# Patient Record
Sex: Female | Born: 1965 | Race: White | Hispanic: No | Marital: Married | Smoking: Never smoker
Health system: Southern US, Community
[De-identification: ages and names within clinical notes are randomized; demographics above are authoritative.]

## PROBLEM LIST (undated history)

## (undated) DIAGNOSIS — D869 Sarcoidosis, unspecified: Secondary | ICD-10-CM

## (undated) DIAGNOSIS — G43909 Migraine, unspecified, not intractable, without status migrainosus: Secondary | ICD-10-CM

## (undated) DIAGNOSIS — E039 Hypothyroidism, unspecified: Secondary | ICD-10-CM

## (undated) DIAGNOSIS — E041 Nontoxic single thyroid nodule: Secondary | ICD-10-CM

## (undated) HISTORY — DX: Migraine, unspecified, not intractable, without status migrainosus: G43.909

## (undated) HISTORY — DX: Nontoxic single thyroid nodule: E04.1

## (undated) HISTORY — DX: Sarcoidosis, unspecified: D86.9

## (undated) HISTORY — DX: Hypothyroidism, unspecified: E03.9

## (undated) HISTORY — PX: OTHER SURGICAL HISTORY: SHX169

---

## 1998-02-02 ENCOUNTER — Other Ambulatory Visit: Admission: RE | Admit: 1998-02-02 | Discharge: 1998-02-02 | Payer: Self-pay | Admitting: Gynecology

## 1998-02-07 ENCOUNTER — Encounter: Admission: RE | Admit: 1998-02-07 | Discharge: 1998-05-08 | Payer: Self-pay | Admitting: Obstetrics and Gynecology

## 1998-02-16 ENCOUNTER — Inpatient Hospital Stay (HOSPITAL_COMMUNITY): Admission: AD | Admit: 1998-02-16 | Discharge: 1998-02-18 | Payer: Self-pay | Admitting: Obstetrics and Gynecology

## 1998-03-28 ENCOUNTER — Other Ambulatory Visit: Admission: RE | Admit: 1998-03-28 | Discharge: 1998-03-28 | Payer: Self-pay | Admitting: Gynecology

## 1998-07-04 ENCOUNTER — Ambulatory Visit (HOSPITAL_COMMUNITY): Admission: RE | Admit: 1998-07-04 | Discharge: 1998-07-04 | Payer: Self-pay | Admitting: Gynecology

## 1998-11-30 ENCOUNTER — Ambulatory Visit (HOSPITAL_COMMUNITY): Admission: RE | Admit: 1998-11-30 | Discharge: 1998-11-30 | Payer: Self-pay | Admitting: Gynecology

## 1999-05-16 ENCOUNTER — Other Ambulatory Visit: Admission: RE | Admit: 1999-05-16 | Discharge: 1999-05-16 | Payer: Self-pay | Admitting: Obstetrics and Gynecology

## 2000-06-26 ENCOUNTER — Other Ambulatory Visit: Admission: RE | Admit: 2000-06-26 | Discharge: 2000-06-26 | Payer: Self-pay | Admitting: Obstetrics and Gynecology

## 2000-10-10 ENCOUNTER — Ambulatory Visit (HOSPITAL_BASED_OUTPATIENT_CLINIC_OR_DEPARTMENT_OTHER): Admission: RE | Admit: 2000-10-10 | Discharge: 2000-10-10 | Payer: Self-pay | Admitting: Surgery

## 2001-09-11 ENCOUNTER — Other Ambulatory Visit: Admission: RE | Admit: 2001-09-11 | Discharge: 2001-09-11 | Payer: Self-pay | Admitting: Obstetrics and Gynecology

## 2002-11-03 ENCOUNTER — Other Ambulatory Visit: Admission: RE | Admit: 2002-11-03 | Discharge: 2002-11-03 | Payer: Self-pay | Admitting: Obstetrics and Gynecology

## 2003-09-19 ENCOUNTER — Ambulatory Visit (HOSPITAL_COMMUNITY): Admission: RE | Admit: 2003-09-19 | Discharge: 2003-09-19 | Payer: Self-pay | Admitting: Obstetrics and Gynecology

## 2005-02-15 ENCOUNTER — Other Ambulatory Visit: Admission: RE | Admit: 2005-02-15 | Discharge: 2005-02-15 | Payer: Self-pay | Admitting: Obstetrics and Gynecology

## 2006-02-26 ENCOUNTER — Ambulatory Visit (HOSPITAL_COMMUNITY): Admission: RE | Admit: 2006-02-26 | Discharge: 2006-02-26 | Payer: Self-pay | Admitting: Internal Medicine

## 2006-03-14 ENCOUNTER — Ambulatory Visit: Payer: Self-pay | Admitting: Internal Medicine

## 2007-04-10 ENCOUNTER — Ambulatory Visit (HOSPITAL_COMMUNITY): Admission: RE | Admit: 2007-04-10 | Discharge: 2007-04-10 | Payer: Self-pay | Admitting: Internal Medicine

## 2008-03-22 ENCOUNTER — Ambulatory Visit: Payer: Self-pay | Admitting: Internal Medicine

## 2008-09-06 ENCOUNTER — Ambulatory Visit (HOSPITAL_COMMUNITY): Admission: RE | Admit: 2008-09-06 | Discharge: 2008-09-06 | Payer: Self-pay | Admitting: Internal Medicine

## 2009-09-07 ENCOUNTER — Ambulatory Visit (HOSPITAL_COMMUNITY): Admission: RE | Admit: 2009-09-07 | Discharge: 2009-09-07 | Payer: Self-pay | Admitting: Internal Medicine

## 2010-10-08 ENCOUNTER — Other Ambulatory Visit (HOSPITAL_COMMUNITY): Payer: Self-pay | Admitting: Internal Medicine

## 2010-10-08 ENCOUNTER — Ambulatory Visit (HOSPITAL_COMMUNITY): Admission: RE | Admit: 2010-10-08 | Payer: Self-pay | Source: Home / Self Care | Admitting: Internal Medicine

## 2010-10-08 DIAGNOSIS — Z0000 Encounter for general adult medical examination without abnormal findings: Secondary | ICD-10-CM

## 2010-10-12 ENCOUNTER — Ambulatory Visit (HOSPITAL_COMMUNITY)
Admission: RE | Admit: 2010-10-12 | Discharge: 2010-10-12 | Disposition: A | Discharge status: Home / Self Care | Payer: BLUE CROSS/BLUE SHIELD | Source: Ambulatory Visit | Attending: Internal Medicine | Admitting: Internal Medicine

## 2010-10-12 DIAGNOSIS — Z0000 Encounter for general adult medical examination without abnormal findings: Secondary | ICD-10-CM

## 2010-10-12 DIAGNOSIS — Z1231 Encounter for screening mammogram for malignant neoplasm of breast: Secondary | ICD-10-CM

## 2010-10-13 ENCOUNTER — Encounter: Payer: Self-pay | Admitting: Internal Medicine

## 2011-01-25 NOTE — Op Note (Signed)
[facility]  Patient:    Susan Griffith, Susan Griffith            MRN: [id number] Proc. Date: 10/10/00 Adm. Date:  [id number] Attending:  Charlton Haws                           Operative Report  CCS [phone].  PREOPERATIVE DIAGNOSIS:  Right inguinal hernia.  POSTOPERATIVE DIAGNOSIS:  Right inguinal hernia.  PROCEDURE:  Repair right inguinal hernia.  SURGEON:  Currie Paris, M.D.  ANESTHESIA:  MAC.  CLINICAL HISTORY:  This patient is a 45 year old with a symptomatic small right inguinal hernia, who elected to have this repaired.  DESCRIPTION OF PROCEDURE:  The patient was brought to the operating room, having had the operative side identified and marked preoperatively in the holding area.  After satisfactory IV sedation was attained, the lower abdomen and groin areas were prepped and draped as a sterile field.  Xylocaine 1% was mixed equally with 0.5% Marcaine with epinephrine and used for local.  I infiltrated along the incision line plus a spot just medial, superior to the anterior superior iliac spine to get a subfascial injection.  Incision was made and deepened to the external oblique aponeurosis, with bleeders coagulated or tied with 4-0 Vicryl.  The external oblique was opened in the line of its fibers.  The cord structures, which consisted of the round ligament, its vessels, and the ilioinguinal nerve, were dissected up off the inguinal floor and elevated with an [employer] retractor.  The inguinal floor appeared intact.  There was preperitoneal fat in a small indirect sac protruding through the anteromedial aspect of the cord.  This was reduced neatly back into the peritoneal cavity and a suture used to hold this in place.  I then took a piece of 3 x 6 inch mesh and cut it to a more appropriate smaller size, tapered it at one end, and sutured it in to reinforce the floor and go around the cord structures.  This was  sutured inferiorly with 2-0 Prolene and tacked medially similarly.  It lay nicely and well past the deep ring and I think recreated a nice deep ring that was just enough to allow the round ligament and nerve to come through without any tethering.  The wound was checked for hemostasis and appeared to be dry.  I infiltrated additional local as I worked.  The external oblique was then closed with 3-0 Vicryl, Scarpas with 3-0 Vicryl, and the skin with 4-0 Monocryl subcuticular plus Steri-Strips.  The patient tolerated the procedure well.  There were no operative complications.  All counts were correct. DD:  10/10/00 TD:  10/11/00 Job: [id number]

## 2011-10-10 ENCOUNTER — Other Ambulatory Visit (HOSPITAL_COMMUNITY): Payer: Self-pay | Admitting: Internal Medicine

## 2011-10-10 DIAGNOSIS — Z1231 Encounter for screening mammogram for malignant neoplasm of breast: Secondary | ICD-10-CM

## 2011-11-08 ENCOUNTER — Ambulatory Visit (HOSPITAL_COMMUNITY)
Admission: RE | Admit: 2011-11-08 | Discharge: 2011-11-08 | Disposition: A | Discharge status: Home / Self Care | Payer: BC Managed Care – PPO | Source: Ambulatory Visit | Attending: Internal Medicine | Admitting: Internal Medicine

## 2011-11-08 DIAGNOSIS — Z1231 Encounter for screening mammogram for malignant neoplasm of breast: Secondary | ICD-10-CM

## 2012-11-12 ENCOUNTER — Other Ambulatory Visit (HOSPITAL_COMMUNITY): Payer: Self-pay | Admitting: Internal Medicine

## 2012-11-12 DIAGNOSIS — Z1231 Encounter for screening mammogram for malignant neoplasm of breast: Secondary | ICD-10-CM

## 2012-11-24 ENCOUNTER — Ambulatory Visit (HOSPITAL_COMMUNITY): Payer: BLUE CROSS/BLUE SHIELD

## 2012-12-01 ENCOUNTER — Ambulatory Visit (HOSPITAL_COMMUNITY): Payer: BC Managed Care – PPO

## 2012-12-09 ENCOUNTER — Ambulatory Visit (HOSPITAL_COMMUNITY): Payer: BC Managed Care – PPO

## 2012-12-21 ENCOUNTER — Ambulatory Visit (HOSPITAL_COMMUNITY)
Admission: RE | Admit: 2012-12-21 | Discharge: 2012-12-21 | Disposition: A | Discharge status: Home / Self Care | Payer: BC Managed Care – PPO | Source: Ambulatory Visit | Attending: Internal Medicine | Admitting: Internal Medicine

## 2012-12-21 DIAGNOSIS — Z1231 Encounter for screening mammogram for malignant neoplasm of breast: Secondary | ICD-10-CM

## 2012-12-22 ENCOUNTER — Other Ambulatory Visit: Payer: Self-pay | Admitting: Internal Medicine

## 2012-12-22 DIAGNOSIS — R928 Other abnormal and inconclusive findings on diagnostic imaging of breast: Secondary | ICD-10-CM

## 2013-01-01 ENCOUNTER — Ambulatory Visit
Admission: RE | Admit: 2013-01-01 | Discharge: 2013-01-01 | Disposition: A | Discharge status: Home / Self Care | Payer: BC Managed Care – PPO | Source: Ambulatory Visit | Attending: Internal Medicine | Admitting: Internal Medicine

## 2013-01-01 DIAGNOSIS — R928 Other abnormal and inconclusive findings on diagnostic imaging of breast: Secondary | ICD-10-CM

## 2013-06-07 ENCOUNTER — Ambulatory Visit: Payer: Self-pay

## 2013-07-23 ENCOUNTER — Ambulatory Visit (INDEPENDENT_AMBULATORY_CARE_PROVIDER_SITE_OTHER): Payer: BC Managed Care – PPO | Admitting: General Surgery

## 2014-02-14 ENCOUNTER — Other Ambulatory Visit (HOSPITAL_COMMUNITY): Payer: Self-pay | Admitting: Internal Medicine

## 2014-02-14 DIAGNOSIS — Z1231 Encounter for screening mammogram for malignant neoplasm of breast: Secondary | ICD-10-CM

## 2014-02-21 ENCOUNTER — Ambulatory Visit (HOSPITAL_COMMUNITY)
Admission: RE | Admit: 2014-02-21 | Discharge: 2014-02-21 | Disposition: A | Discharge status: Home / Self Care | Payer: BC Managed Care – PPO | Source: Ambulatory Visit | Attending: Internal Medicine | Admitting: Internal Medicine

## 2014-02-21 DIAGNOSIS — Z1231 Encounter for screening mammogram for malignant neoplasm of breast: Secondary | ICD-10-CM | POA: Insufficient documentation

## 2015-03-27 ENCOUNTER — Other Ambulatory Visit (HOSPITAL_COMMUNITY): Payer: Self-pay | Admitting: Internal Medicine

## 2015-03-27 DIAGNOSIS — Z1231 Encounter for screening mammogram for malignant neoplasm of breast: Secondary | ICD-10-CM

## 2015-04-04 ENCOUNTER — Ambulatory Visit (HOSPITAL_COMMUNITY): Payer: Self-pay

## 2015-04-11 ENCOUNTER — Ambulatory Visit (HOSPITAL_COMMUNITY): Payer: Self-pay

## 2015-04-13 ENCOUNTER — Ambulatory Visit (HOSPITAL_COMMUNITY)
Admission: RE | Admit: 2015-04-13 | Discharge: 2015-04-13 | Disposition: A | Discharge status: Home / Self Care | Payer: BLUE CROSS/BLUE SHIELD | Source: Ambulatory Visit | Attending: Internal Medicine | Admitting: Internal Medicine

## 2015-04-13 DIAGNOSIS — Z1231 Encounter for screening mammogram for malignant neoplasm of breast: Secondary | ICD-10-CM | POA: Diagnosis not present

## 2016-01-18 DIAGNOSIS — Z124 Encounter for screening for malignant neoplasm of cervix: Secondary | ICD-10-CM | POA: Diagnosis not present

## 2016-01-18 DIAGNOSIS — R635 Abnormal weight gain: Secondary | ICD-10-CM | POA: Diagnosis not present

## 2016-01-18 DIAGNOSIS — Z0001 Encounter for general adult medical examination with abnormal findings: Secondary | ICD-10-CM | POA: Diagnosis not present

## 2016-01-18 DIAGNOSIS — E040 Nontoxic diffuse goiter: Secondary | ICD-10-CM | POA: Diagnosis not present

## 2016-02-28 DIAGNOSIS — E040 Nontoxic diffuse goiter: Secondary | ICD-10-CM | POA: Diagnosis not present

## 2016-04-19 DIAGNOSIS — R030 Elevated blood-pressure reading, without diagnosis of hypertension: Secondary | ICD-10-CM | POA: Diagnosis not present

## 2016-04-19 DIAGNOSIS — E040 Nontoxic diffuse goiter: Secondary | ICD-10-CM | POA: Diagnosis not present

## 2016-04-19 DIAGNOSIS — E069 Thyroiditis, unspecified: Secondary | ICD-10-CM | POA: Diagnosis not present

## 2016-04-19 DIAGNOSIS — Z6830 Body mass index (BMI) 30.0-30.9, adult: Secondary | ICD-10-CM | POA: Diagnosis not present

## 2016-06-24 ENCOUNTER — Other Ambulatory Visit: Payer: Self-pay | Admitting: Internal Medicine

## 2016-06-24 DIAGNOSIS — Z1231 Encounter for screening mammogram for malignant neoplasm of breast: Secondary | ICD-10-CM

## 2016-07-10 ENCOUNTER — Ambulatory Visit
Admission: RE | Admit: 2016-07-10 | Discharge: 2016-07-10 | Disposition: A | Discharge status: Home / Self Care | Payer: BLUE CROSS/BLUE SHIELD | Source: Ambulatory Visit | Attending: Internal Medicine | Admitting: Internal Medicine

## 2016-07-10 DIAGNOSIS — Z1231 Encounter for screening mammogram for malignant neoplasm of breast: Secondary | ICD-10-CM

## 2016-08-19 DIAGNOSIS — Z6830 Body mass index (BMI) 30.0-30.9, adult: Secondary | ICD-10-CM | POA: Diagnosis not present

## 2016-08-19 DIAGNOSIS — E069 Thyroiditis, unspecified: Secondary | ICD-10-CM | POA: Diagnosis not present

## 2016-10-23 DIAGNOSIS — E039 Hypothyroidism, unspecified: Secondary | ICD-10-CM | POA: Diagnosis not present

## 2016-10-23 DIAGNOSIS — R635 Abnormal weight gain: Secondary | ICD-10-CM | POA: Diagnosis not present

## 2016-10-23 DIAGNOSIS — J019 Acute sinusitis, unspecified: Secondary | ICD-10-CM | POA: Diagnosis not present

## 2016-10-23 DIAGNOSIS — R030 Elevated blood-pressure reading, without diagnosis of hypertension: Secondary | ICD-10-CM | POA: Diagnosis not present

## 2016-12-16 DIAGNOSIS — E663 Overweight: Secondary | ICD-10-CM | POA: Diagnosis not present

## 2016-12-16 DIAGNOSIS — R030 Elevated blood-pressure reading, without diagnosis of hypertension: Secondary | ICD-10-CM | POA: Diagnosis not present

## 2017-02-26 DIAGNOSIS — R030 Elevated blood-pressure reading, without diagnosis of hypertension: Secondary | ICD-10-CM | POA: Diagnosis not present

## 2017-02-26 DIAGNOSIS — E663 Overweight: Secondary | ICD-10-CM | POA: Diagnosis not present

## 2017-02-26 DIAGNOSIS — E039 Hypothyroidism, unspecified: Secondary | ICD-10-CM | POA: Diagnosis not present

## 2017-05-06 DIAGNOSIS — Z6831 Body mass index (BMI) 31.0-31.9, adult: Secondary | ICD-10-CM | POA: Diagnosis not present

## 2017-05-06 DIAGNOSIS — Z0001 Encounter for general adult medical examination with abnormal findings: Secondary | ICD-10-CM | POA: Diagnosis not present

## 2017-05-06 DIAGNOSIS — R030 Elevated blood-pressure reading, without diagnosis of hypertension: Secondary | ICD-10-CM | POA: Diagnosis not present

## 2017-06-19 DIAGNOSIS — E039 Hypothyroidism, unspecified: Secondary | ICD-10-CM | POA: Diagnosis not present

## 2017-06-19 DIAGNOSIS — Z0001 Encounter for general adult medical examination with abnormal findings: Secondary | ICD-10-CM | POA: Diagnosis not present

## 2017-06-19 DIAGNOSIS — I10 Essential (primary) hypertension: Secondary | ICD-10-CM | POA: Diagnosis not present

## 2017-07-07 DIAGNOSIS — E039 Hypothyroidism, unspecified: Secondary | ICD-10-CM | POA: Diagnosis not present

## 2017-07-07 DIAGNOSIS — R030 Elevated blood-pressure reading, without diagnosis of hypertension: Secondary | ICD-10-CM | POA: Diagnosis not present

## 2017-07-07 DIAGNOSIS — E663 Overweight: Secondary | ICD-10-CM | POA: Diagnosis not present

## 2017-08-22 ENCOUNTER — Other Ambulatory Visit: Payer: Self-pay | Admitting: Internal Medicine

## 2017-08-22 DIAGNOSIS — Z1231 Encounter for screening mammogram for malignant neoplasm of breast: Secondary | ICD-10-CM

## 2017-09-24 ENCOUNTER — Ambulatory Visit
Admission: RE | Admit: 2017-09-24 | Discharge: 2017-09-24 | Disposition: A | Discharge status: Home / Self Care | Payer: BLUE CROSS/BLUE SHIELD | Source: Ambulatory Visit | Attending: Internal Medicine | Admitting: Internal Medicine

## 2017-09-24 DIAGNOSIS — Z1231 Encounter for screening mammogram for malignant neoplasm of breast: Secondary | ICD-10-CM

## 2017-12-29 DIAGNOSIS — M546 Pain in thoracic spine: Secondary | ICD-10-CM | POA: Diagnosis not present

## 2017-12-29 DIAGNOSIS — M9901 Segmental and somatic dysfunction of cervical region: Secondary | ICD-10-CM | POA: Diagnosis not present

## 2017-12-29 DIAGNOSIS — M9902 Segmental and somatic dysfunction of thoracic region: Secondary | ICD-10-CM | POA: Diagnosis not present

## 2017-12-29 DIAGNOSIS — M5413 Radiculopathy, cervicothoracic region: Secondary | ICD-10-CM | POA: Diagnosis not present

## 2017-12-30 DIAGNOSIS — M546 Pain in thoracic spine: Secondary | ICD-10-CM | POA: Diagnosis not present

## 2017-12-30 DIAGNOSIS — M5413 Radiculopathy, cervicothoracic region: Secondary | ICD-10-CM | POA: Diagnosis not present

## 2017-12-30 DIAGNOSIS — M9902 Segmental and somatic dysfunction of thoracic region: Secondary | ICD-10-CM | POA: Diagnosis not present

## 2017-12-30 DIAGNOSIS — M9901 Segmental and somatic dysfunction of cervical region: Secondary | ICD-10-CM | POA: Diagnosis not present

## 2018-01-01 DIAGNOSIS — M9902 Segmental and somatic dysfunction of thoracic region: Secondary | ICD-10-CM | POA: Diagnosis not present

## 2018-01-01 DIAGNOSIS — M9901 Segmental and somatic dysfunction of cervical region: Secondary | ICD-10-CM | POA: Diagnosis not present

## 2018-01-01 DIAGNOSIS — M5413 Radiculopathy, cervicothoracic region: Secondary | ICD-10-CM | POA: Diagnosis not present

## 2018-01-01 DIAGNOSIS — M546 Pain in thoracic spine: Secondary | ICD-10-CM | POA: Diagnosis not present

## 2018-01-05 DIAGNOSIS — M5413 Radiculopathy, cervicothoracic region: Secondary | ICD-10-CM | POA: Diagnosis not present

## 2018-01-05 DIAGNOSIS — M546 Pain in thoracic spine: Secondary | ICD-10-CM | POA: Diagnosis not present

## 2018-01-05 DIAGNOSIS — M9901 Segmental and somatic dysfunction of cervical region: Secondary | ICD-10-CM | POA: Diagnosis not present

## 2018-01-05 DIAGNOSIS — M9902 Segmental and somatic dysfunction of thoracic region: Secondary | ICD-10-CM | POA: Diagnosis not present

## 2018-01-08 DIAGNOSIS — M5413 Radiculopathy, cervicothoracic region: Secondary | ICD-10-CM | POA: Diagnosis not present

## 2018-01-08 DIAGNOSIS — M9902 Segmental and somatic dysfunction of thoracic region: Secondary | ICD-10-CM | POA: Diagnosis not present

## 2018-01-08 DIAGNOSIS — M9901 Segmental and somatic dysfunction of cervical region: Secondary | ICD-10-CM | POA: Diagnosis not present

## 2018-01-08 DIAGNOSIS — M546 Pain in thoracic spine: Secondary | ICD-10-CM | POA: Diagnosis not present

## 2018-01-13 DIAGNOSIS — M5413 Radiculopathy, cervicothoracic region: Secondary | ICD-10-CM | POA: Diagnosis not present

## 2018-01-13 DIAGNOSIS — M9902 Segmental and somatic dysfunction of thoracic region: Secondary | ICD-10-CM | POA: Diagnosis not present

## 2018-01-13 DIAGNOSIS — M546 Pain in thoracic spine: Secondary | ICD-10-CM | POA: Diagnosis not present

## 2018-01-13 DIAGNOSIS — M9901 Segmental and somatic dysfunction of cervical region: Secondary | ICD-10-CM | POA: Diagnosis not present

## 2018-02-05 DIAGNOSIS — M9901 Segmental and somatic dysfunction of cervical region: Secondary | ICD-10-CM | POA: Diagnosis not present

## 2018-02-05 DIAGNOSIS — M5413 Radiculopathy, cervicothoracic region: Secondary | ICD-10-CM | POA: Diagnosis not present

## 2018-02-05 DIAGNOSIS — M9902 Segmental and somatic dysfunction of thoracic region: Secondary | ICD-10-CM | POA: Diagnosis not present

## 2018-02-05 DIAGNOSIS — M546 Pain in thoracic spine: Secondary | ICD-10-CM | POA: Diagnosis not present

## 2018-04-24 ENCOUNTER — Encounter: Payer: Self-pay | Admitting: Nurse Practitioner

## 2018-04-24 ENCOUNTER — Ambulatory Visit: Payer: Self-pay | Admitting: Nurse Practitioner

## 2018-04-24 VITALS — BP 141/88 | HR 76 | Resp 16 | Ht 68.75 in | Wt 209.4 lb

## 2018-04-24 DIAGNOSIS — N959 Unspecified menopausal and perimenopausal disorder: Secondary | ICD-10-CM

## 2018-04-24 DIAGNOSIS — E669 Obesity, unspecified: Secondary | ICD-10-CM | POA: Diagnosis not present

## 2018-04-24 DIAGNOSIS — M25551 Pain in right hip: Secondary | ICD-10-CM | POA: Insufficient documentation

## 2018-04-24 MED ORDER — PHENTERMINE HCL 37.5 MG PO TABS: 37.5000 mg | ORAL_TABLET | Freq: Every day | ORAL | 1 refills | Status: DC

## 2018-04-24 NOTE — Progress Notes (Signed)
[facility] [place], [place] [address]  Internal MEDICINE  Office Visit Note  Patient Name: Susan Griffith  [id number]  [id number]  Date of Service: 05/06/2018  Chief Complaint  Patient presents with  . Medical Management of Chronic Issues    medications    The patient is c/o moderate right hip pain. Has been getting more severe over time. Hurts to sit or stand on right left for long periods of time. Starting to interfere with her normal daily activities. Reports fatigue and difficulty with losing weight. Has been carefully monitoring her diet and has been exercising regularly. She has lost 1 pound over last 10 months. Has done well on phentermine in the past and would like to try this again.       Current Medication: Outpatient Encounter Medications as of 04/24/2018  Medication Sig  . albuterol (PROVENTIL HFA;VENTOLIN HFA) 108 (90 Base) MCG/ACT inhaler Inhale into the lungs every 6 (six) hours as needed for wheezing or shortness of breath.  . hydrochlorothiazide (HYDRODIURIL) 12.5 MG tablet Take 12.5 mg by mouth daily.  . phentermine (ADIPEX-P) 37.5 MG tablet Take 1 tablet (37.5 mg total) by mouth daily before breakfast.  . [DISCONTINUED] Phendimetrazine Tartrate 105 MG CP24 Take by mouth.   No facility-administered encounter medications on file as of 04/24/2018.     Surgical History: Past Surgical History:  Procedure Laterality Date  . child birth     natural    Medical History: Past Medical History:  Diagnosis Date  . Hypothyroidism   . Sarcoidosis     Family History: Family History  Problem Relation Age of Onset  . Hyperlipidemia Mother   . Hypertension Mother     Social History   Socioeconomic History  . Marital status: Married    Spouse name: Not on file  . Number of children: Not on file  . Years of education: Not on file  . Highest education level: Not on file  Occupational History  . Not on file  Social  Needs  . Financial resource strain: Not on file  . Food insecurity:    Worry: Not on file    Inability: Not on file  . Transportation needs:    Medical: Not on file    Non-medical: Not on file  Tobacco Use  . Smoking status: Never Smoker  . Smokeless tobacco: Never Used  Substance and Sexual Activity  . Alcohol use: Yes    Comment: ocassionally  . Drug use: Never  . Sexual activity: Not on file  Lifestyle  . Physical activity:    Days per week: Not on file    Minutes per session: Not on file  . Stress: Not on file  Relationships  . Social connections:    Talks on phone: Not on file    Gets together: Not on file    Attends religious service: Not on file    Active member of club or organization: Not on file    Attends meetings of clubs or organizations: Not on file    Relationship status: Not on file  . Intimate partner violence:    Fear of current or ex partner: Not on file    Emotionally abused: Not on file    Physically abused: Not on file    Forced sexual activity: Not on file  Other Topics Concern  . Not on file  Social History Narrative  . Not on file      Review of Systems  Constitutional:  Positive for fatigue. Negative for chills and unexpected weight change.  HENT: Negative for congestion, postnasal drip, rhinorrhea, sneezing and sore throat.   Eyes: Negative.  Negative for redness.  Respiratory: Negative for cough, chest tightness and shortness of breath.   Cardiovascular: Negative for chest pain and palpitations.  Gastrointestinal: Negative for abdominal pain, constipation, diarrhea, nausea and vomiting.  Endocrine:       History of hypothyroid, however, she has been euthyroid over last few lab checks.   Genitourinary: Negative.  Negative for dysuria and frequency.  Musculoskeletal: Positive for arthralgias and myalgias. Negative for back pain, joint swelling and neck pain.       Pain in right hip.  Skin: Negative for rash.  Allergic/Immunologic:  Negative for environmental allergies.  Neurological: Negative for dizziness, tremors, numbness and headaches.  Hematological: Negative for adenopathy. Does not bruise/bleed easily.  Psychiatric/Behavioral: Negative for behavioral problems (Depression), sleep disturbance and suicidal ideas. The patient is not nervous/anxious.     Today's Vitals   04/24/18 1128  BP: (!) 141/88  Pulse: 76  Resp: 16  SpO2: 100%  Weight: 209 lb 6.4 oz (95 kg)  Height: 5' 8.75" (1.746 m)    Physical Exam  Constitutional: She is oriented to person, place, and time. She appears well-developed and well-nourished. No distress.  HENT:  Head: Normocephalic and atraumatic.  Nose: Nose normal.  Mouth/Throat: Oropharynx is clear and moist. No oropharyngeal exudate.  Eyes: Pupils are equal, round, and reactive to light. Conjunctivae and EOM are normal.  Neck: Normal range of motion. Neck supple. No JVD present. No tracheal deviation present. No thyromegaly present.  Cardiovascular: Normal rate, regular rhythm and normal heart sounds. Exam reveals no gallop and no friction rub.  No murmur heard. Pulmonary/Chest: Effort normal and breath sounds normal. No respiratory distress. She has no wheezes. She has no rales. She exhibits no tenderness.  Abdominal: Soft. Bowel sounds are normal. There is no tenderness.  Musculoskeletal: Normal range of motion.  Tenderness with direct palpation of the right hip. Visible discomfort when changing seated positions. No visible or palpable abnormalities present at this time .  Lymphadenopathy:    She has no cervical adenopathy.  Neurological: She is alert and oriented to person, place, and time. No cranial nerve deficit.  Skin: Skin is warm and dry. She is not diaphoretic.  Psychiatric: She has a normal mood and affect. Her behavior is normal. Judgment and thought content normal.  Nursing note and vitals reviewed.  Assessment/Plan: 1. Right hip pain Recommend OTC  anti-inflammatory along with acetaminophen as needed and as indicated to reduce pain/inflammation. Will get x-ray of the hip for further evaluation. Refer to orthopedics as indicated.  - DG HIP UNILAT WITH PELVIS 2-3 VIEWS RIGHT; Future  2. Unspecified menopausal and perimenopausal disorder Will check labs including reproductive and thyroid panels for further evaluation.   3. Mild obesity Restart phentermine 37.5mg  tablets daily. Advised her to follow 1200 calorie diet and to participate in routine, cardiovascular exercise.  - phentermine (ADIPEX-P) 37.5 MG tablet; Take 1 tablet (37.5 mg total) by mouth daily before breakfast.  Dispense: 30 tablet; Refill: 1  General Counseling: [place] verbalizes understanding of the findings of todays visit and agrees with plan of treatment. I have discussed any further diagnostic evaluation that may be needed or ordered today. We also reviewed her medications today. she has been encouraged to call the office with any questions or concerns that should arise related to todays visit.   There is a  liability release in patients' chart. There has been a 10 minute discussion about the side effects including but not limited to elevated blood pressure, anxiety, lack of sleep and dry mouth. Pt understands and will like to start/continue on appetite suppressant at this time. There will be one month RX given at the time of visit with proper follow up. Nova diet plan with restricted calories is given to the pt. Pt understands and agrees with  plan of treatment  This patient was seen by Leretha Pol FNP Collaboration with Dr Lavera Guise as a part of collaborative care agreement  Orders Placed This Encounter  Procedures  . DG HIP UNILAT WITH PELVIS 2-3 VIEWS RIGHT    Meds ordered this encounter  Medications  . phentermine (ADIPEX-P) 37.5 MG tablet    Sig: Take 1 tablet (37.5 mg total) by mouth daily before breakfast.    Dispense:  30 tablet    Refill:  1    Order  Specific Question:   Supervising Provider    Answer:   Lavera Guise [[id number]]    Time spent: 21 Minutes      Dr Lavera Guise Internal medicine

## 2018-06-05 ENCOUNTER — Ambulatory Visit: Payer: Self-pay | Admitting: Nurse Practitioner

## 2018-06-16 DIAGNOSIS — M25511 Pain in right shoulder: Secondary | ICD-10-CM | POA: Diagnosis not present

## 2018-06-16 DIAGNOSIS — M25551 Pain in right hip: Secondary | ICD-10-CM | POA: Diagnosis not present

## 2018-06-23 ENCOUNTER — Encounter: Payer: Self-pay | Admitting: Nurse Practitioner

## 2018-06-23 ENCOUNTER — Ambulatory Visit: Payer: BLUE CROSS/BLUE SHIELD | Admitting: Nurse Practitioner

## 2018-06-23 VITALS — BP 139/93 | HR 77 | Resp 16 | Ht 69.0 in | Wt 199.6 lb

## 2018-06-23 DIAGNOSIS — E039 Hypothyroidism, unspecified: Secondary | ICD-10-CM

## 2018-06-23 DIAGNOSIS — Z0001 Encounter for general adult medical examination with abnormal findings: Secondary | ICD-10-CM

## 2018-06-23 DIAGNOSIS — E669 Obesity, unspecified: Secondary | ICD-10-CM | POA: Diagnosis not present

## 2018-06-23 DIAGNOSIS — E559 Vitamin D deficiency, unspecified: Secondary | ICD-10-CM

## 2018-06-23 DIAGNOSIS — Z411 Encounter for cosmetic surgery: Secondary | ICD-10-CM | POA: Insufficient documentation

## 2018-06-23 MED ORDER — PHENTERMINE HCL 37.5 MG PO TABS: 37.5000 mg | ORAL_TABLET | Freq: Every day | ORAL | 1 refills | Status: DC

## 2018-06-23 NOTE — Progress Notes (Addendum)
[facility] [place], [place] [address]  Internal MEDICINE  Office Visit Note  Patient Name: Susan Griffith  [id number]  [id number]  Date of Service: 06/23/2018  Chief Complaint  Patient presents with  . Medical Management of Chronic Issues    6wk follow up weright management    The patient is currently on phentermine to help with weight management. She has lost 11 pounds since her last visit. Has not had negative side effects related to taking this medication.  Has not had labs drawn. Sold her home and had to move. Is living out of boxes and has misplaced her lab slip.  Has not had the x-ray on her hip. Did see orthopedic provider while taking her son to orthopedist. Was given physical therapy exercises to do at home which have helped tremendously.       Current Medication: Outpatient Encounter Medications as of 06/23/2018  Medication Sig  . albuterol (PROVENTIL HFA;VENTOLIN HFA) 108 (90 Base) MCG/ACT inhaler Inhale into the lungs every 6 (six) hours as needed for wheezing or shortness of breath.  . hydrochlorothiazide (HYDRODIURIL) 12.5 MG tablet Take 12.5 mg by mouth daily.  . phentermine (ADIPEX-P) 37.5 MG tablet Take 1 tablet (37.5 mg total) by mouth daily before breakfast.  . [DISCONTINUED] phentermine (ADIPEX-P) 37.5 MG tablet Take 1 tablet (37.5 mg total) by mouth daily before breakfast.   No facility-administered encounter medications on file as of 06/23/2018.     Surgical History: Past Surgical History:  Procedure Laterality Date  . child birth     natural    Medical History: Past Medical History:  Diagnosis Date  . Hypothyroidism   . Sarcoidosis     Family History: Family History  Problem Relation Age of Onset  . Hyperlipidemia Mother   . Hypertension Mother     Social History   Socioeconomic History  . Marital status: Married    Spouse name: Not on file  . Number of children: Not on file  . Years of education: Not  on file  . Highest education level: Not on file  Occupational History  . Not on file  Social Needs  . Financial resource strain: Not on file  . Food insecurity:    Worry: Not on file    Inability: Not on file  . Transportation needs:    Medical: Not on file    Non-medical: Not on file  Tobacco Use  . Smoking status: Never Smoker  . Smokeless tobacco: Never Used  Substance and Sexual Activity  . Alcohol use: Yes    Comment: ocassionally  . Drug use: Never  . Sexual activity: Not on file  Lifestyle  . Physical activity:    Days per week: Not on file    Minutes per session: Not on file  . Stress: Not on file  Relationships  . Social connections:    Talks on phone: Not on file    Gets together: Not on file    Attends religious service: Not on file    Active member of club or organization: Not on file    Attends meetings of clubs or organizations: Not on file    Relationship status: Not on file  . Intimate partner violence:    Fear of current or ex partner: Not on file    Emotionally abused: Not on file    Physically abused: Not on file    Forced sexual activity: Not on file  Other Topics Concern  . Not  on file  Social History Narrative  . Not on file      Review of Systems  Constitutional: Negative for chills, fatigue and unexpected weight change.       Weight loss of 11 pounds since her last visit.   HENT: Negative for congestion, postnasal drip, rhinorrhea, sneezing and sore throat.   Eyes: Negative.  Negative for redness.  Respiratory: Negative for cough, chest tightness and shortness of breath.   Cardiovascular: Negative for chest pain and palpitations.  Gastrointestinal: Negative for abdominal pain, constipation, diarrhea, nausea and vomiting.  Endocrine: Positive for heat intolerance. Negative for cold intolerance, polydipsia, polyphagia and polyuria.       History of hypothyroid, however, she has been euthyroid over last few lab checks. Has been taking OTC  Amveren to help with hot flashes. Has noted improvement in these symptoms.   Genitourinary: Negative.  Negative for dysuria and frequency.  Musculoskeletal: Positive for arthralgias and myalgias. Negative for back pain, joint swelling and neck pain.       Right hip pain is nearly resolved. Continues to do physical therapy exercises at home.   Skin: Negative for rash.  Allergic/Immunologic: Positive for environmental allergies.  Neurological: Negative for dizziness, tremors, numbness and headaches.  Hematological: Negative for adenopathy. Does not bruise/bleed easily.  Psychiatric/Behavioral: Negative for behavioral problems (Depression), sleep disturbance and suicidal ideas. The patient is not nervous/anxious.     Today's Vitals   06/23/18 0928  BP: (!) 139/93  Pulse: 77  Resp: 16  SpO2: 99%  Weight: 199 lb 9.6 oz (90.5 kg)  Height: 5\' 9"  (1.753 m)    Physical Exam  Constitutional: She is oriented to person, place, and time. She appears well-developed and well-nourished. No distress.  HENT:  Head: Normocephalic and atraumatic.  Nose: Nose normal.  Mouth/Throat: Oropharynx is clear and moist. No oropharyngeal exudate.  Eyes: Pupils are equal, round, and reactive to light. Conjunctivae and EOM are normal.  Neck: Normal range of motion. Neck supple. No JVD present. No tracheal deviation present. No thyromegaly present.  Cardiovascular: Normal rate, regular rhythm and normal heart sounds. Exam reveals no gallop and no friction rub.  No murmur heard. Pulmonary/Chest: Effort normal and breath sounds normal. No respiratory distress. She has no wheezes. She has no rales. She exhibits no tenderness.  Abdominal: Soft. Bowel sounds are normal.  Musculoskeletal: Normal range of motion.  Lymphadenopathy:    She has no cervical adenopathy.  Neurological: She is alert and oriented to person, place, and time. No cranial nerve deficit.  Skin: Skin is warm and dry. She is not diaphoretic.   Psychiatric: She has a normal mood and affect. Her behavior is normal. Judgment and thought content normal.  Nursing note and vitals reviewed.  Assessment/Plan: 1. Acquired hypothyroidism Check thyroid panel and treat as indicated.  - T4, free - TSH - Lipid panel  2. Mild obesity Improving. Continue phentermine 37.5mg  tablets every day. Continue with low calorie diet and increased exercise.  - phentermine (ADIPEX-P) 37.5 MG tablet; Take 1 tablet (37.5 mg total) by mouth daily before breakfast.  Dispense: 30 tablet; Refill: 1  3. Vitamin D deficiency - Vitamin D 1,25 dihydroxy    General Counseling: K[place]ia verbalizes understanding of the findings of todays visit and agrees with plan of treatment. I have discussed any further diagnostic evaluation that may be needed or ordered today. We also reviewed her medications today. she has been encouraged to call the office with any questions or concerns that should  arise related to todays visit.   There is a liability release in patients' chart. There has been a 10 minute discussion about the side effects including but not limited to elevated blood pressure, anxiety, lack of sleep and dry mouth. Pt understands and will like to start/continue on appetite suppressant at this time. There will be one month RX given at the time of visit with proper follow up. Nova diet plan with restricted calories is given to the pt. Pt understands and agrees with  plan of treatment  This patient was seen by Leretha Pol FNP Collaboration with Dr Lavera Guise as a part of collaborative care agreement  Orders Placed This Encounter  Procedures  . CBC with Differential/Platelet  . Comprehensive metabolic panel  . T4, free  . TSH  . Lipid panel  . Vitamin D 1,25 dihydroxy    Meds ordered this encounter  Medications  . phentermine (ADIPEX-P) 37.5 MG tablet    Sig: Take 1 tablet (37.5 mg total) by mouth daily before breakfast.    Dispense:  30 tablet     Refill:  1    Order Specific Question:   Supervising Provider    Answer:   Lavera Guise [[id number]]    Time spent: 76 Minutes      Dr Lavera Guise Internal medicine

## 2018-06-25 ENCOUNTER — Other Ambulatory Visit: Payer: Self-pay | Admitting: Nurse Practitioner

## 2018-06-25 DIAGNOSIS — M25551 Pain in right hip: Secondary | ICD-10-CM | POA: Diagnosis not present

## 2018-06-25 DIAGNOSIS — E039 Hypothyroidism, unspecified: Secondary | ICD-10-CM | POA: Diagnosis not present

## 2018-06-25 DIAGNOSIS — E559 Vitamin D deficiency, unspecified: Secondary | ICD-10-CM | POA: Diagnosis not present

## 2018-06-25 DIAGNOSIS — M25511 Pain in right shoulder: Secondary | ICD-10-CM | POA: Diagnosis not present

## 2018-06-25 DIAGNOSIS — Z0001 Encounter for general adult medical examination with abnormal findings: Secondary | ICD-10-CM | POA: Diagnosis not present

## 2018-06-25 NOTE — Addendum Note (Signed)
Addended by: Leretha Pol on: 06/25/2018 02:53 PM   Modules accepted: Level of Service

## 2018-06-26 LAB — LIPID PANEL W/O CHOL/HDL RATIO
Cholesterol, Total: 177 mg/dL (ref 100–199)
HDL: 50 mg/dL (ref >39)
LDL Calculated: 99 mg/dL (ref 0–99)
Triglycerides: 138 mg/dL (ref 0–149)
VLDL CHOLESTEROL CAL: 28 mg/dL (ref 5–40)

## 2018-06-26 LAB — COMPREHENSIVE METABOLIC PANEL
ALBUMIN: 4.4 g/dL (ref 3.5–5.5)
ALK PHOS: 94 IU/L (ref 39–117)
ALT: 20 IU/L (ref 0–32)
AST: 14 IU/L (ref 0–40)
Albumin/Globulin Ratio: 1.8 (ref 1.2–2.2)
BUN / CREAT RATIO: 27 — AB (ref 9–23)
BUN: 22 mg/dL (ref 6–24)
Bilirubin Total: <0.2 mg/dL (ref 0.0–1.2)
CALCIUM: 9.8 mg/dL (ref 8.7–10.2)
CO2: 25 mmol/L (ref 20–29)
CREATININE: 0.82 mg/dL (ref 0.57–1.00)
Chloride: 102 mmol/L (ref 96–106)
GFR calc Af Amer: 95 mL/min/{1.73_m2} (ref >59)
GFR, EST NON AFRICAN AMERICAN: 83 mL/min/{1.73_m2} (ref >59)
GLOBULIN, TOTAL: 2.5 g/dL (ref 1.5–4.5)
Glucose: 95 mg/dL (ref 65–99)
Potassium: 4.5 mmol/L (ref 3.5–5.2)
SODIUM: 142 mmol/L (ref 134–144)
Total Protein: 6.9 g/dL (ref 6.0–8.5)

## 2018-06-26 LAB — CBC
Hematocrit: 42.9 % (ref 34.0–46.6)
Hemoglobin: 14.1 g/dL (ref 11.1–15.9)
MCH: 28.8 pg (ref 26.6–33.0)
MCHC: 32.9 g/dL (ref 31.5–35.7)
MCV: 88 fL (ref 79–97)
PLATELETS: 336 10*3/uL (ref 150–450)
RBC: 4.89 x10E6/uL (ref 3.77–5.28)
RDW: 13.7 % (ref 12.3–15.4)
WBC: 5.6 10*3/uL (ref 3.4–10.8)

## 2018-06-26 LAB — VITAMIN D 25 HYDROXY (VIT D DEFICIENCY, FRACTURES): Vit D, 25-Hydroxy: 38.9 ng/mL (ref 30.0–100.0)

## 2018-06-26 LAB — T3: T3, Total: 134 ng/dL (ref 71–180)

## 2018-06-26 LAB — TSH: TSH: 2.04 u[IU]/mL (ref 0.450–4.500)

## 2018-06-26 LAB — T4, FREE: Free T4: 1.17 ng/dL (ref 0.82–1.77)

## 2018-07-02 DIAGNOSIS — M25511 Pain in right shoulder: Secondary | ICD-10-CM | POA: Diagnosis not present

## 2018-07-02 DIAGNOSIS — M25551 Pain in right hip: Secondary | ICD-10-CM | POA: Diagnosis not present

## 2018-07-03 ENCOUNTER — Telehealth: Payer: Self-pay

## 2018-07-03 NOTE — Telephone Encounter (Signed)
-----   Message from Ronnell Freshwater, NP sent at 06/29/2018  1:44 PM EDT ----- Can you send the patient a note letting her know that her labs were great. Thanks.

## 2018-07-03 NOTE — Telephone Encounter (Signed)
Mailed result letter to patient

## 2018-07-09 DIAGNOSIS — M25551 Pain in right hip: Secondary | ICD-10-CM | POA: Diagnosis not present

## 2018-07-09 DIAGNOSIS — M25511 Pain in right shoulder: Secondary | ICD-10-CM | POA: Diagnosis not present

## 2018-07-21 DIAGNOSIS — M25511 Pain in right shoulder: Secondary | ICD-10-CM | POA: Diagnosis not present

## 2018-07-21 DIAGNOSIS — M25551 Pain in right hip: Secondary | ICD-10-CM | POA: Diagnosis not present

## 2018-08-04 ENCOUNTER — Encounter: Payer: Self-pay | Admitting: Nurse Practitioner

## 2018-08-04 ENCOUNTER — Ambulatory Visit: Payer: BLUE CROSS/BLUE SHIELD | Admitting: Nurse Practitioner

## 2018-08-04 VITALS — BP 137/94 | HR 75 | Resp 16 | Ht 69.0 in | Wt 199.0 lb

## 2018-08-04 DIAGNOSIS — E039 Hypothyroidism, unspecified: Secondary | ICD-10-CM | POA: Diagnosis not present

## 2018-08-04 DIAGNOSIS — E669 Obesity, unspecified: Secondary | ICD-10-CM | POA: Diagnosis not present

## 2018-08-04 MED ORDER — PHENTERMINE HCL 37.5 MG PO TABS: 37.5000 mg | ORAL_TABLET | Freq: Every day | ORAL | 1 refills | Status: DC

## 2018-08-04 NOTE — Progress Notes (Signed)
[facility] [place], [place] [address]  Internal MEDICINE  Office Visit Note  Patient Name: Susan Griffith  [id number]  [id number]  Date of Service: 08/08/2018  Chief Complaint  Patient presents with  . Medical Management of Chronic Issues    6 week follow up weight management    The patient is here for routine visit for weight management. She has maintained her weight over the past 6 months. She is taking phentermine every day. She has limited her calorie count to under 1500 calories per day and is exercising regularly. She has no negative side effects associated with taking phentermine. Would like to have refill. She has had her lab work done prior to this visit. Results were normal.       Current Medication: Outpatient Encounter Medications as of 08/04/2018  Medication Sig  . albuterol (PROVENTIL HFA;VENTOLIN HFA) 108 (90 Base) MCG/ACT inhaler Inhale into the lungs every 6 (six) hours as needed for wheezing or shortness of breath.  . hydrochlorothiazide (HYDRODIURIL) 12.5 MG tablet Take 12.5 mg by mouth daily.  . phentermine (ADIPEX-P) 37.5 MG tablet Take 1 tablet (37.5 mg total) by mouth daily before breakfast.  . [DISCONTINUED] phentermine (ADIPEX-P) 37.5 MG tablet Take 1 tablet (37.5 mg total) by mouth daily before breakfast.   No facility-administered encounter medications on file as of 08/04/2018.     Surgical History: Past Surgical History:  Procedure Laterality Date  . child birth     natural    Medical History: Past Medical History:  Diagnosis Date  . Hypothyroidism   . Sarcoidosis     Family History: Family History  Problem Relation Age of Onset  . Hyperlipidemia Mother   . Hypertension Mother     Social History   Socioeconomic History  . Marital status: Married    Spouse name: Not on file  . Number of children: Not on file  . Years of education: Not on file  . Highest education level: Not on file  Occupational  History  . Not on file  Social Needs  . Financial resource strain: Not on file  . Food insecurity:    Worry: Not on file    Inability: Not on file  . Transportation needs:    Medical: Not on file    Non-medical: Not on file  Tobacco Use  . Smoking status: Never Smoker  . Smokeless tobacco: Never Used  Substance and Sexual Activity  . Alcohol use: Yes    Comment: ocassionally  . Drug use: Never  . Sexual activity: Not on file  Lifestyle  . Physical activity:    Days per week: Not on file    Minutes per session: Not on file  . Stress: Not on file  Relationships  . Social connections:    Talks on phone: Not on file    Gets together: Not on file    Attends religious service: Not on file    Active member of club or organization: Not on file    Attends meetings of clubs or organizations: Not on file    Relationship status: Not on file  . Intimate partner violence:    Fear of current or ex partner: Not on file    Emotionally abused: Not on file    Physically abused: Not on file    Forced sexual activity: Not on file  Other Topics Concern  . Not on file  Social History Narrative  . Not on file  Review of Systems  Constitutional: Negative for chills, fatigue and unexpected weight change.       Weight stable since her last visit .  HENT: Negative for congestion, postnasal drip, rhinorrhea, sneezing and sore throat.   Eyes: Negative.  Negative for redness.  Respiratory: Negative for cough, chest tightness and shortness of breath.   Cardiovascular: Negative for chest pain and palpitations.  Gastrointestinal: Negative for abdominal pain, constipation, diarrhea, nausea and vomiting.  Endocrine: Positive for heat intolerance. Negative for cold intolerance, polydipsia, polyphagia and polyuria.       History of hypothyroid, however, she has been euthyroid over last few lab checks. Recent thyroid panel was normal.   Musculoskeletal: Negative for arthralgias, back pain, joint  swelling, myalgias and neck pain.  Skin: Negative for rash.  Allergic/Immunologic: Negative for environmental allergies.  Neurological: Negative for dizziness, tremors, numbness and headaches.  Hematological: Negative for adenopathy. Does not bruise/bleed easily.  Psychiatric/Behavioral: Negative for behavioral problems (Depression), sleep disturbance and suicidal ideas. The patient is not nervous/anxious.     Vital Signs: BP (!) 137/94 (BP Location: Right Arm, Patient Position: Sitting, Cuff Size: Large)   Pulse 75   Resp 16   Ht 5\' 9"  (1.753 m)   Wt 199 lb (90.3 kg)   SpO2 95%   BMI 29.39 kg/m    Physical Exam  Constitutional: She is oriented to person, place, and time. She appears well-developed and well-nourished. No distress.  HENT:  Head: Normocephalic and atraumatic.  Mouth/Throat: No oropharyngeal exudate.  Eyes: Pupils are equal, round, and reactive to light. EOM are normal.  Neck: Normal range of motion. Neck supple. No JVD present. No tracheal deviation present. No thyromegaly present.  Cardiovascular: Normal rate, regular rhythm and normal heart sounds. Exam reveals no gallop and no friction rub.  No murmur heard. Pulmonary/Chest: Effort normal and breath sounds normal. No respiratory distress. She has no wheezes. She has no rales. She exhibits no tenderness.  Musculoskeletal: Normal range of motion.  Lymphadenopathy:    She has no cervical adenopathy.  Neurological: She is alert and oriented to person, place, and time. No cranial nerve deficit.  Skin: Skin is warm and dry. She is not diaphoretic.  Psychiatric: She has a normal mood and affect. Her behavior is normal. Judgment and thought content normal.  Nursing note and vitals reviewed.  Assessment/Plan: 1. Mild obesity Overall, doing well with phentermine. May continue to take this daily. Limit calorie count to 1500 calories and continue to incorporate exercise into daily routine.  - phentermine (ADIPEX-P) 37.5  MG tablet; Take 1 tablet (37.5 mg total) by mouth daily before breakfast.  Dispense: 30 tablet; Refill: 1  2. Acquired hypothyroidism Recent thyroid panel normal. Will continue to monitor.   General Counseling: Alois verbalizes understanding of the findings of todays visit and agrees with plan of treatment. I have discussed any further diagnostic evaluation that may be needed or ordered today. We also reviewed her medications today. she has been encouraged to call the office with any questions or concerns that should arise related to todays visit.   There is a liability release in patients' chart. There has been a 10 minute discussion about the side effects including but not limited to elevated blood pressure, anxiety, lack of sleep and dry mouth. Pt understands and will like to start/continue on appetite suppressant at this time. There will be one month RX given at the time of visit with proper follow up. Nova diet plan with restricted calories is  given to the pt. Pt understands and agrees with  plan of treatment  This patient was seen by Leretha Pol FNP Collaboration with Dr Lavera Guise as a part of collaborative care agreement  Meds ordered this encounter  Medications  . phentermine (ADIPEX-P) 37.5 MG tablet    Sig: Take 1 tablet (37.5 mg total) by mouth daily before breakfast.    Dispense:  30 tablet    Refill:  1    Order Specific Question:   Supervising Provider    Answer:   Lavera Guise [[id number]]    Time spent: 7 Minutes      Dr Lavera Guise Internal medicine

## 2018-08-19 ENCOUNTER — Ambulatory Visit: Payer: Self-pay | Admitting: Adult Health

## 2018-08-20 ENCOUNTER — Ambulatory Visit: Payer: Self-pay | Admitting: Adult Health

## 2018-08-20 DIAGNOSIS — Z411 Encounter for cosmetic surgery: Secondary | ICD-10-CM

## 2018-08-21 NOTE — Progress Notes (Signed)
Patient treated with Botox Cosmetic/ OnabotulinumtoxinA  Risks and Benefits explained to patient, Written consent obtained and signed.   Lot#: [id number] Exp: 01/2021  Glabellar: 18 Units Frontalis: 0 units Crows Feet: 0 Units Other: 0 Units  Total: 18 Units  Aftercare instructions given to patient.  Patient denied any questions.  Will follow up in 14 days for results review.   Procedure by Dr. Clayborn Bigness Dictated by Orson Gear AGNP

## 2018-08-24 DIAGNOSIS — M25551 Pain in right hip: Secondary | ICD-10-CM | POA: Diagnosis not present

## 2018-08-24 DIAGNOSIS — M25511 Pain in right shoulder: Secondary | ICD-10-CM | POA: Diagnosis not present

## 2018-08-31 ENCOUNTER — Ambulatory Visit: Payer: BLUE CROSS/BLUE SHIELD | Admitting: Internal Medicine

## 2018-08-31 DIAGNOSIS — Z411 Encounter for cosmetic surgery: Secondary | ICD-10-CM

## 2018-09-17 ENCOUNTER — Ambulatory Visit: Payer: Self-pay | Admitting: Nurse Practitioner

## 2018-09-22 ENCOUNTER — Other Ambulatory Visit: Payer: Self-pay | Admitting: Internal Medicine

## 2018-09-22 DIAGNOSIS — Z1231 Encounter for screening mammogram for malignant neoplasm of breast: Secondary | ICD-10-CM

## 2018-10-20 ENCOUNTER — Ambulatory Visit
Admission: RE | Admit: 2018-10-20 | Discharge: 2018-10-20 | Disposition: A | Discharge status: Home / Self Care | Payer: BLUE CROSS/BLUE SHIELD | Source: Ambulatory Visit | Attending: Internal Medicine | Admitting: Internal Medicine

## 2018-10-20 DIAGNOSIS — Z1231 Encounter for screening mammogram for malignant neoplasm of breast: Secondary | ICD-10-CM | POA: Diagnosis not present

## 2018-10-21 ENCOUNTER — Other Ambulatory Visit: Payer: Self-pay | Admitting: Internal Medicine

## 2018-10-21 DIAGNOSIS — R928 Other abnormal and inconclusive findings on diagnostic imaging of breast: Secondary | ICD-10-CM

## 2018-10-22 HISTORY — PX: OTHER SURGICAL HISTORY: SHX169

## 2018-10-23 ENCOUNTER — Ambulatory Visit
Admission: RE | Admit: 2018-10-23 | Discharge: 2018-10-23 | Disposition: A | Discharge status: Home / Self Care | Payer: BLUE CROSS/BLUE SHIELD | Source: Ambulatory Visit | Attending: Internal Medicine | Admitting: Internal Medicine

## 2018-10-23 DIAGNOSIS — R922 Inconclusive mammogram: Secondary | ICD-10-CM | POA: Diagnosis not present

## 2018-10-23 DIAGNOSIS — N6489 Other specified disorders of breast: Secondary | ICD-10-CM | POA: Diagnosis not present

## 2018-10-23 DIAGNOSIS — R928 Other abnormal and inconclusive findings on diagnostic imaging of breast: Secondary | ICD-10-CM

## 2018-10-26 ENCOUNTER — Ambulatory Visit: Payer: BC Managed Care – PPO | Admitting: Adult Health

## 2018-10-26 ENCOUNTER — Other Ambulatory Visit: Payer: Self-pay

## 2018-10-26 ENCOUNTER — Encounter: Payer: Self-pay | Admitting: Adult Health

## 2018-10-26 VITALS — BP 136/92 | HR 81 | Resp 16 | Ht 69.0 in | Wt 207.0 lb

## 2018-10-26 DIAGNOSIS — Z01818 Encounter for other preprocedural examination: Secondary | ICD-10-CM | POA: Diagnosis not present

## 2018-10-26 NOTE — Patient Instructions (Signed)
Electrocardiogram  An electrocardiogram (ECG or EKG) is a test to check your heart. The test is simple and safe, and it does not hurt. It may be done as a part of a physical exam. It may also be done to check out symptoms like chest pain or a fast or irregular heartbeat (palpitations). Tell a health care provider about:  Any allergies you have. What are the risks? There are no risks. What happens before the procedure? There is nothing you need to do to prepare. What happens during the procedure?  You will take off your clothes from the waist up.  You will lie on your back.  Hair may be removed from your chest, arms, and legs.  Sticky patches (electrodes) will be placed on your chest, arms, and legs.  Wires (leads) will be attached to the sticky patches and to a machine.  You will be asked to relax and lie still while the machine checks your heart. The procedure may vary among doctors and hospitals. What happens after the procedure?  Your test results will be looked at by a doctor.  It is up to you to get your test results. Ask your doctor, or the department that is doing the test, when your results will be ready. Summary  An electrocardiogram (ECG or EKG) is a test to check your heart.  The test is simple and safe, and it does not hurt. There are no risks of having this test. You do not need to prepare for the test.  Sticky patches (electrodes) will be placed on your chest, arms, and legs. Wires (leads) will be attached to the sticky patches and to a machine. While you lie still on your back, the machine will check your heart. This information is not intended to replace advice given to you by your health care provider. Make sure you discuss any questions you have with your health care provider. Document Released: 08/08/2008 Document Revised: 09/11/2016 Document Reviewed: 09/11/2016 Elsevier Interactive Patient Education  2019 [employer] Inc[employer]  

## 2018-10-26 NOTE — Progress Notes (Signed)
[facility] [place], [place] [address]  Internal MEDICINE  Office Visit Note  Patient Name: Susan Griffith  [id number]  [id number]  Date of Service: 10/26/2018  Chief Complaint  Patient presents with  . Medical Clearance    plastic surgery , fat removal from thighs, and boob lift , labs and ekg     HPI  Pt is here for medical clearance.  She reports two days from now she is having Thigh lift. She will likely have a breast lift performed later this year.  Her surgeon is requesting medical clearance from Susan Griffith at this time. Pt Denies Chest pain, Shortness of breath, palpitations, headache, or blurred vision.    Current Medication: Outpatient Encounter Medications as of 10/26/2018  Medication Sig  . albuterol (PROVENTIL HFA;VENTOLIN HFA) 108 (90 Base) MCG/ACT inhaler Inhale into the lungs every 6 (six) hours as needed for wheezing or shortness of breath.  . hydrochlorothiazide (HYDRODIURIL) 12.5 MG tablet Take 12.5 mg by mouth daily.  . phentermine (ADIPEX-P) 37.5 MG tablet Take 1 tablet (37.5 mg total) by mouth daily before breakfast. (Patient not taking: Reported on 10/26/2018)   No facility-administered encounter medications on file as of 10/26/2018.     Surgical History: Past Surgical History:  Procedure Laterality Date  . child birth     natural    Medical History: Past Medical History:  Diagnosis Date  . Hypothyroidism   . Sarcoidosis     Family History: Family History  Problem Relation Age of Onset  . Hyperlipidemia Mother   . Hypertension Mother     Social History   Socioeconomic History  . Marital status: Married    Spouse name: Not on file  . Number of children: Not on file  . Years of education: Not on file  . Highest education level: Not on file  Occupational History  . Not on file  Social Needs  . Financial resource strain: Not on file  . Food insecurity:    Worry: Not on file    Inability: Not on file  .  Transportation needs:    Medical: Not on file    Non-medical: Not on file  Tobacco Use  . Smoking status: Never Smoker  . Smokeless tobacco: Never Used  Substance and Sexual Activity  . Alcohol use: Yes    Comment: ocassionally  . Drug use: Never  . Sexual activity: Not on file  Lifestyle  . Physical activity:    Days per week: Not on file    Minutes per session: Not on file  . Stress: Not on file  Relationships  . Social connections:    Talks on phone: Not on file    Gets together: Not on file    Attends religious service: Not on file    Active member of club or organization: Not on file    Attends meetings of clubs or organizations: Not on file    Relationship status: Not on file  . Intimate partner violence:    Fear of current or ex partner: Not on file    Emotionally abused: Not on file    Physically abused: Not on file    Forced sexual activity: Not on file  Other Topics Concern  . Not on file  Social History Narrative  . Not on file      Review of Systems  Constitutional: Negative for chills, fatigue and unexpected weight change.  HENT: Negative for congestion, rhinorrhea, sneezing and sore throat.  Eyes: Negative for photophobia, pain and redness.  Respiratory: Negative for cough, chest tightness and shortness of breath.   Cardiovascular: Negative for chest pain and palpitations.  Gastrointestinal: Negative for abdominal pain, constipation, diarrhea, nausea and vomiting.  Endocrine: Negative.   Genitourinary: Negative for dysuria and frequency.  Musculoskeletal: Negative for arthralgias, back pain, joint swelling and neck pain.  Skin: Negative for rash.  Allergic/Immunologic: Negative.   Neurological: Negative for tremors and numbness.  Hematological: Negative for adenopathy. Does not bruise/bleed easily.  Psychiatric/Behavioral: Negative for behavioral problems and sleep disturbance. The patient is not nervous/anxious.     Vital Signs: BP (!) 136/92    Pulse 81   Resp 16   Ht 5\' 9"  (1.753 m)   Wt 207 lb (93.9 kg)   SpO2 96%   BMI 30.57 kg/m    Physical Exam Vitals signs and nursing note reviewed.  Constitutional:      General: She is not in acute distress.    Appearance: She is well-developed. She is not diaphoretic.  HENT:     Head: Normocephalic and atraumatic.     Mouth/Throat:     Pharynx: No oropharyngeal exudate.  Eyes:     Pupils: Pupils are equal, round, and reactive to light.  Neck:     Musculoskeletal: Normal range of motion and neck supple.     Thyroid: No thyromegaly.     Vascular: No JVD.     Trachea: No tracheal deviation.  Cardiovascular:     Rate and Rhythm: Normal rate and regular rhythm.     Heart sounds: Normal heart sounds. No murmur. No friction rub. No gallop.   Pulmonary:     Effort: Pulmonary effort is normal. No respiratory distress.     Breath sounds: Normal breath sounds. No wheezing or rales.  Chest:     Chest wall: No tenderness.  Abdominal:     Palpations: Abdomen is soft.     Tenderness: There is no abdominal tenderness. There is no guarding.  Musculoskeletal: Normal range of motion.  Lymphadenopathy:     Cervical: No cervical adenopathy.  Skin:    General: Skin is warm and dry.  Neurological:     Mental Status: She is alert and oriented to person, place, and time.     Cranial Nerves: No cranial nerve deficit.  Psychiatric:        Behavior: Behavior normal.        Thought Content: Thought content normal.        Judgment: Judgment normal.    Assessment/Plan: 1. Preop examination NSR-My read.  PT is clear for surgery.  As always at the discretion of anesthesia, and patient weighing risks and benefits of procedure.  - EKG 12-Lead  General Counseling: Susan Griffith verbalizes understanding of the findings of todays visit and agrees with plan of treatment. I have discussed any further diagnostic evaluation that may be needed or ordered today. We also reviewed her medications today. she has  been encouraged to call the office with any questions or concerns that should arise related to todays visit.    Orders Placed This Encounter  Procedures  . EKG 12-Lead    No orders of the defined types were placed in this encounter.   Time spent: 25 Minutes   This patient was seen by Orson Gear AGNP-C in Collaboration with Dr Lavera Guise as a part of collaborative care agreement     Kendell Bane AGNP-C Internal medicine

## 2018-12-01 ENCOUNTER — Other Ambulatory Visit: Payer: Self-pay

## 2018-12-01 ENCOUNTER — Ambulatory Visit: Payer: BC Managed Care – PPO | Admitting: Adult Health

## 2018-12-01 DIAGNOSIS — Z411 Encounter for cosmetic surgery: Secondary | ICD-10-CM

## 2018-12-01 MED ORDER — FAMCICLOVIR 500 MG PO TABS: 500.0000 mg | ORAL_TABLET | Freq: Two times a day (BID) | ORAL | 0 refills | Status: DC

## 2018-12-01 NOTE — Progress Notes (Signed)
Patient treated with Botox Cosmetic/ OnabotulinumtoxinA  Risks and Benefits explained to patient, Written consent obtained and signed.   Lot#: [id number] Exp: 8/22  Glabellar: 8 Units Frontalis: 10 units Crows Feet: 0 Units Other: 0 Units  Total: 18 Units  Aftercare instructions given to patient.  Patient denied any questions.  Will follow up in 14 days for results review.

## 2019-01-19 ENCOUNTER — Encounter: Payer: Self-pay | Admitting: Adult Health

## 2019-02-08 ENCOUNTER — Ambulatory Visit: Payer: BC Managed Care – PPO | Admitting: Internal Medicine

## 2019-02-08 ENCOUNTER — Other Ambulatory Visit: Payer: Self-pay

## 2019-02-08 DIAGNOSIS — Z411 Encounter for cosmetic surgery: Secondary | ICD-10-CM

## 2019-02-08 NOTE — Progress Notes (Signed)
Patient treated with Botox Cosmetic/ OnabotulinumtoxinA  Risks and Benefits explained to patient, Written consent obtained and signed.     Glabellar: 0 Units Frontalis: 8units Crows Feet: 0Units Other: 0 Units  Total: 8 Units   Pt was also given one syringe of voluma by Restylane is given in the Glabellar deep wrinkles and peri oral deep wrinkles  Aftercare instructions given to patient.  Patient denied any questions.  Will follow up in 14 days for results review.

## 2019-02-16 NOTE — Progress Notes (Signed)
Patient treated with Botox Cosmetic/ OnabotulinumtoxinA and fillers with Restylne Voluma   Risks and Benefits explained to patient, Written consent obtained and signed.     Glabellar:2 Units 9 TOUCH UP)    Pt was injected in deep wrinkles in peri oral and glabellar folds  Aftercare instructions given to patient.  Patient denied any questions.  Will follow up in 14 days for results review.

## 2019-03-04 ENCOUNTER — Encounter: Payer: Self-pay | Admitting: Nurse Practitioner

## 2019-03-04 ENCOUNTER — Ambulatory Visit: Payer: BC Managed Care – PPO | Admitting: Nurse Practitioner

## 2019-03-04 ENCOUNTER — Other Ambulatory Visit: Payer: Self-pay

## 2019-03-04 VITALS — BP 131/95 | HR 76 | Resp 16 | Ht 69.0 in | Wt 198.4 lb

## 2019-03-04 DIAGNOSIS — R030 Elevated blood-pressure reading, without diagnosis of hypertension: Secondary | ICD-10-CM

## 2019-03-04 DIAGNOSIS — E669 Obesity, unspecified: Secondary | ICD-10-CM | POA: Diagnosis not present

## 2019-03-04 MED ORDER — PHENTERMINE HCL 37.5 MG PO TABS: 37.5000 mg | ORAL_TABLET | Freq: Every day | ORAL | 1 refills | Status: DC

## 2019-03-04 MED ORDER — HYDROCHLOROTHIAZIDE 12.5 MG PO TABS: 12.5000 mg | ORAL_TABLET | Freq: Every day | ORAL | 3 refills | Status: DC

## 2019-03-04 NOTE — Progress Notes (Signed)
Pt blood pressure elevated, informed provider. 

## 2019-03-04 NOTE — Progress Notes (Signed)
[facility] [place], [place] [address]  Internal MEDICINE  Office Visit Note  Patient Name: Susan Griffith  [id number]  [id number]  Date of Service: 03/04/2019  Chief Complaint  Patient presents with  . Medical Management of Chronic Issues    Follow up weight management    The patient is here for follow up visit. She would like to restart on weight loss medication. She has been off weight loss medication for several months. Came off prior to having cosmetic surgery on her legs. Has maintained her weight over the past few months without weight gain. Has healed from surgery. Is ready to restart phentermine and restart weight loss program. She is having some issue with water retention, especially around times she should have menstrual cycle. Would like to continue with HCTZ low dose to take as needed.       Current Medication: Outpatient Encounter Medications as of 03/04/2019  Medication Sig  . albuterol (PROVENTIL HFA;VENTOLIN HFA) 108 (90 Base) MCG/ACT inhaler Inhale into the lungs every 6 (six) hours as needed for wheezing or shortness of breath.  . famciclovir (FAMVIR) 500 MG tablet Take 1 tablet (500 mg total) by mouth 2 (two) times daily.  . hydrochlorothiazide (HYDRODIURIL) 12.5 MG tablet Take 1 tablet (12.5 mg total) by mouth daily.  Marland Kitchen HYDROcodone-acetaminophen (NORCO/VICODIN) 5-325 MG tablet TK 1 TO 2 TS PO Q 6 H PRF MODERATE PAIN. DO NOT TK WITH OTHER PAIN MEDS  . [DISCONTINUED] hydrochlorothiazide (HYDRODIURIL) 12.5 MG tablet Take 12.5 mg by mouth daily.  . phentermine (ADIPEX-P) 37.5 MG tablet Take 1 tablet (37.5 mg total) by mouth daily before breakfast.  . [DISCONTINUED] phentermine (ADIPEX-P) 37.5 MG tablet Take 1 tablet (37.5 mg total) by mouth daily before breakfast. (Patient not taking: Reported on 10/26/2018)   No facility-administered encounter medications on file as of 03/04/2019.     Surgical History: Past Surgical History:  Procedure  Laterality Date  . child birth     natural  . thigh surgery  10/22/2018    Medical History: Past Medical History:  Diagnosis Date  . Hypothyroidism   . Sarcoidosis     Family History: Family History  Problem Relation Age of Onset  . Hyperlipidemia Mother   . Hypertension Mother     Social History   Socioeconomic History  . Marital status: Married    Spouse name: Not on file  . Number of children: Not on file  . Years of education: Not on file  . Highest education level: Not on file  Occupational History  . Not on file  Social Needs  . Financial resource strain: Not on file  . Food insecurity    Worry: Not on file    Inability: Not on file  . Transportation needs    Medical: Not on file    Non-medical: Not on file  Tobacco Use  . Smoking status: Never Smoker  . Smokeless tobacco: Never Used  Substance and Sexual Activity  . Alcohol use: Yes    Comment: ocassionally  . Drug use: Never  . Sexual activity: Not on file  Lifestyle  . Physical activity    Days per week: Not on file    Minutes per session: Not on file  . Stress: Not on file  Relationships  . Social Herbalist on phone: Not on file    Gets together: Not on file    Attends religious service: Not on file    Active  member of club or organization: Not on file    Attends meetings of clubs or organizations: Not on file    Relationship status: Not on file  . Intimate partner violence    Fear of current or ex partner: Not on file    Emotionally abused: Not on file    Physically abused: Not on file    Forced sexual activity: Not on file  Other Topics Concern  . Not on file  Social History Narrative  . Not on file      Review of Systems  Constitutional: Negative for chills, fatigue and unexpected weight change.  HENT: Negative for congestion, postnasal drip, rhinorrhea, sneezing and sore throat.   Eyes: Negative for redness.  Respiratory: Negative for cough, chest tightness and  shortness of breath.   Cardiovascular: Negative for chest pain and palpitations.       Reports some fluid retention once monthly.   Gastrointestinal: Negative for abdominal pain, constipation, diarrhea, nausea and vomiting.  Endocrine: Negative for cold intolerance, heat intolerance, polydipsia and polyuria.  Musculoskeletal: Negative for arthralgias, back pain, joint swelling and neck pain.  Skin: Negative for rash.  Neurological: Negative.  Negative for tremors and numbness.  Hematological: Negative for adenopathy. Does not bruise/bleed easily.  Psychiatric/Behavioral: Negative for behavioral problems (Depression), sleep disturbance and suicidal ideas. The patient is not nervous/anxious.     Today's Vitals   03/04/19 1444  BP: (!) 131/95  Pulse: 76  Resp: 16  SpO2: 99%  Weight: 198 lb 6.4 oz (90 kg)  Height: 5\' 9"  (1.753 m)   Body mass index is 29.3 kg/m.  Physical Exam Vitals signs and nursing note reviewed.  Constitutional:      General: She is not in acute distress.    Appearance: Normal appearance. She is well-developed. She is not diaphoretic.  HENT:     Head: Normocephalic and atraumatic.     Mouth/Throat:     Pharynx: No oropharyngeal exudate.  Eyes:     Pupils: Pupils are equal, round, and reactive to light.  Neck:     Musculoskeletal: Normal range of motion and neck supple.     Thyroid: No thyromegaly.     Vascular: No JVD.     Trachea: No tracheal deviation.  Cardiovascular:     Rate and Rhythm: Normal rate and regular rhythm.     Heart sounds: Normal heart sounds. No murmur. No friction rub. No gallop.   Pulmonary:     Effort: Pulmonary effort is normal. No respiratory distress.     Breath sounds: Normal breath sounds. No wheezing or rales.  Chest:     Chest wall: No tenderness.  Musculoskeletal: Normal range of motion.  Lymphadenopathy:     Cervical: No cervical adenopathy.  Skin:    General: Skin is warm and dry.  Neurological:     Mental Status:  She is alert and oriented to person, place, and time.     Cranial Nerves: No cranial nerve deficit.  Psychiatric:        Behavior: Behavior normal.        Thought Content: Thought content normal.        Judgment: Judgment normal.    Assessment/Plan:  1. Elevated blood pressure reading May take HCTZ 12.5mg  daily if needed for fluid retention.  - hydrochlorothiazide (HYDRODIURIL) 12.5 MG tablet; Take 1 tablet (12.5 mg total) by mouth daily.  Dispense: 30 tablet; Refill: 3  2. Mild obesity Restart phentermine. 37.5mg  daily. Limit calorie intake to 1200-1500  calories per day. Incorporate exercise into daily routine.  - phentermine (ADIPEX-P) 37.5 MG tablet; Take 1 tablet (37.5 mg total) by mouth daily before breakfast.  Dispense: 30 tablet; Refill: 1  General Counseling: Kawehi verbalizes understanding of the findings of todays visit and agrees with plan of treatment. I have discussed any further diagnostic evaluation that may be needed or ordered today. We also reviewed her medications today. she has been encouraged to call the office with any questions or concerns that should arise related to todays visit.   There is a liability release in patients' chart. There has been a 10 minute discussion about the side effects including but not limited to elevated blood pressure, anxiety, lack of sleep and dry mouth. Pt understands and will like to start/continue on appetite suppressant at this time. There will be one month RX given at the time of visit with proper follow up. Nova diet plan with restricted calories is given to the pt. Pt understands and agrees with  plan of treatment  This patient was seen by Leretha Pol FNP Collaboration with Dr Lavera Guise as a part of collaborative care agreement  Meds ordered this encounter  Medications  . hydrochlorothiazide (HYDRODIURIL) 12.5 MG tablet    Sig: Take 1 tablet (12.5 mg total) by mouth daily.    Dispense:  30 tablet    Refill:  3    Order  Specific Question:   Supervising Provider    Answer:   Lavera Guise [[id number]]  . phentermine (ADIPEX-P) 37.5 MG tablet    Sig: Take 1 tablet (37.5 mg total) by mouth daily before breakfast.    Dispense:  30 tablet    Refill:  1    Order Specific Question:   Supervising Provider    Answer:   Lavera Guise [[id number]]    Time spent: 49 Minutes      Dr Lavera Guise Internal medicine

## 2019-03-29 ENCOUNTER — Encounter: Payer: Self-pay | Admitting: Nurse Practitioner

## 2019-03-29 ENCOUNTER — Ambulatory Visit: Payer: BC Managed Care – PPO | Admitting: Nurse Practitioner

## 2019-03-29 ENCOUNTER — Other Ambulatory Visit: Payer: Self-pay

## 2019-03-29 VITALS — BP 130/89 | HR 83 | Resp 16 | Ht 69.0 in | Wt 198.8 lb

## 2019-03-29 DIAGNOSIS — Z411 Encounter for cosmetic surgery: Secondary | ICD-10-CM | POA: Diagnosis not present

## 2019-03-29 DIAGNOSIS — E039 Hypothyroidism, unspecified: Secondary | ICD-10-CM

## 2019-03-29 DIAGNOSIS — Z01818 Encounter for other preprocedural examination: Secondary | ICD-10-CM | POA: Diagnosis not present

## 2019-03-29 DIAGNOSIS — R030 Elevated blood-pressure reading, without diagnosis of hypertension: Secondary | ICD-10-CM

## 2019-03-29 NOTE — Progress Notes (Signed)
[facility] [place], [place] [address]  Internal MEDICINE  Office Visit Note  Patient Name: Susan Griffith  [id number]  [id number]  Date of Service: 03/31/2019  Chief Complaint  Patient presents with  . Medical Management of Chronic Issues    pt is here for preop eval, needs ekg, labs, and last mammogram    The patient is scheduled to have breast reconstruction surgery 04/14/2019. She is here to have clearance. An ECG is done showing mild atrial enlargement and otherwise within normal limits. She will need to have CMP and CBC labs. She states that she feels well and has no concerns or complaints to discuss       Current Medication: Outpatient Encounter Medications as of 03/29/2019  Medication Sig  . albuterol (PROVENTIL HFA;VENTOLIN HFA) 108 (90 Base) MCG/ACT inhaler Inhale into the lungs every 6 (six) hours as needed for wheezing or shortness of breath.  . famciclovir (FAMVIR) 500 MG tablet Take 1 tablet (500 mg total) by mouth 2 (two) times daily.  . hydrochlorothiazide (HYDRODIURIL) 12.5 MG tablet Take 1 tablet (12.5 mg total) by mouth daily.  Marland Kitchen HYDROcodone-acetaminophen (NORCO/VICODIN) 5-325 MG tablet TK 1 TO 2 TS PO Q 6 H PRF MODERATE PAIN. DO NOT TK WITH OTHER PAIN MEDS  . phentermine (ADIPEX-P) 37.5 MG tablet Take 1 tablet (37.5 mg total) by mouth daily before breakfast.   No facility-administered encounter medications on file as of 03/29/2019.     Surgical History: Past Surgical History:  Procedure Laterality Date  . child birth     natural  . thigh surgery  10/22/2018    Medical History: Past Medical History:  Diagnosis Date  . Hypothyroidism   . Sarcoidosis     Family History: Family History  Problem Relation Age of Onset  . Hyperlipidemia Mother   . Hypertension Mother     Social History   Socioeconomic History  . Marital status: Married    Spouse name: Not on file  . Number of children: Not on file  . Years of  education: Not on file  . Highest education level: Not on file  Occupational History  . Not on file  Social Needs  . Financial resource strain: Not on file  . Food insecurity    Worry: Not on file    Inability: Not on file  . Transportation needs    Medical: Not on file    Non-medical: Not on file  Tobacco Use  . Smoking status: Never Smoker  . Smokeless tobacco: Never Used  Substance and Sexual Activity  . Alcohol use: Yes    Comment: ocassionally  . Drug use: Never  . Sexual activity: Not on file  Lifestyle  . Physical activity    Days per week: Not on file    Minutes per session: Not on file  . Stress: Not on file  Relationships  . Social Herbalist on phone: Not on file    Gets together: Not on file    Attends religious service: Not on file    Active member of club or organization: Not on file    Attends meetings of clubs or organizations: Not on file    Relationship status: Not on file  . Intimate partner violence    Fear of current or ex partner: Not on file    Emotionally abused: Not on file    Physically abused: Not on file    Forced sexual activity: Not on file  Other Topics Concern  . Not on file  Social History Narrative  . Not on file      Review of Systems  Constitutional: Negative for activity change, chills, fatigue and unexpected weight change.  HENT: Negative for congestion, postnasal drip, rhinorrhea, sneezing and sore throat.   Respiratory: Negative for cough, chest tightness and shortness of breath.   Cardiovascular: Negative for chest pain and palpitations.  Gastrointestinal: Negative for abdominal pain, constipation, diarrhea, nausea and vomiting.  Endocrine: Negative for cold intolerance, heat intolerance, polydipsia and polyuria.  Musculoskeletal: Negative for arthralgias, back pain, joint swelling and neck pain.  Skin: Negative for rash.  Neurological: Negative for dizziness, tremors, numbness and headaches.  Hematological:  Negative for adenopathy. Does not bruise/bleed easily.  Psychiatric/Behavioral: Negative for behavioral problems (Depression), sleep disturbance and suicidal ideas. The patient is not nervous/anxious.    Today's Vitals   03/29/19 1611  BP: 130/89  Pulse: 83  Resp: 16  SpO2: 100%  Weight: 198 lb 12.8 oz (90.2 kg)  Height: _0  (1.753 m)   Body mass index is 29.36 kg/m.  Physical Exam Vitals signs and nursing note reviewed.  Constitutional:      General: She is not in acute distress.    Appearance: Normal appearance. She is well-developed. She is not diaphoretic.  HENT:     Head: Normocephalic and atraumatic.     Mouth/Throat:     Pharynx: No oropharyngeal exudate.  Eyes:     Pupils: Pupils are equal, round, and reactive to light.  Neck:     Musculoskeletal: Normal range of motion and neck supple.     Thyroid: No thyromegaly.     Vascular: No JVD.     Trachea: No tracheal deviation.  Cardiovascular:     Rate and Rhythm: Normal rate and regular rhythm.     Heart sounds: Normal heart sounds. No murmur. No friction rub. No gallop.      Comments: ECG is borderline with left atrial enlargement. This is unchanged from prior ECG.  Pulmonary:     Effort: Pulmonary effort is normal. No respiratory distress.     Breath sounds: Normal breath sounds. No wheezing or rales.  Chest:     Chest wall: No tenderness.  Abdominal:     General: Bowel sounds are normal.     Palpations: Abdomen is soft.     Tenderness: There is no abdominal tenderness.  Musculoskeletal: Normal range of motion.  Lymphadenopathy:     Cervical: No cervical adenopathy.  Skin:    General: Skin is warm and dry.  Neurological:     Mental Status: She is alert and oriented to person, place, and time.     Cranial Nerves: No cranial nerve deficit.  Psychiatric:        Behavior: Behavior normal.        Thought Content: Thought content normal.        Judgment: Judgment normal.    Assessment/Plan: 1. Encounter  for other preprocedural examination ECG showing left atrial enlargement. CBC and CMP good. Patient is cleared to have surgery as scheduled with minimal risk. - CBC w/Diff/Platelet; Future - CMP14+EGFR; Future - CMP14+EGFR - CBC w/Diff/Platelet  2. Elevated blood pressure reading Well controlled through diet and exercise - CBC w/Diff/Platelet; Future - CMP14+EGFR; Future - CMP14+EGFR - CBC w/Diff/Platelet  3. Acquired hypothyroidism Well controlled without medication.  4. Encounter for cosmetic surgery - EKG 12-Lead showing mild atrial enlargement. Otherwise normal and unchanged from prior ECGs. Patient cleared to  have surgery as scheduled with minimal risk.   General Counseling: Susan Griffith verbalizes understanding of the findings of todays visit and agrees with plan of treatment. I have discussed any further diagnostic evaluation that may be needed or ordered today. We also reviewed her medications today. she has been encouraged to call the office with any questions or concerns that should arise related to todays visit.  This patient was seen by Leretha Pol FNP Collaboration with Dr Lavera Guise as a part of collaborative care agreement  Orders Placed This Encounter  Procedures  . CBC w/Diff/Platelet  . CMP14+EGFR  . EKG 12-Lead     Time spent: 61 Minutes      Dr Lavera Guise Internal medicine

## 2019-04-01 DIAGNOSIS — Z01818 Encounter for other preprocedural examination: Secondary | ICD-10-CM | POA: Diagnosis not present

## 2019-04-01 DIAGNOSIS — R030 Elevated blood-pressure reading, without diagnosis of hypertension: Secondary | ICD-10-CM | POA: Diagnosis not present

## 2019-04-02 LAB — CMP14+EGFR
ALT: 18 IU/L (ref 0–32)
AST: 18 IU/L (ref 0–40)
Albumin/Globulin Ratio: 2.1 (ref 1.2–2.2)
Albumin: 4.8 g/dL (ref 3.8–4.9)
Alkaline Phosphatase: 102 IU/L (ref 39–117)
BUN/Creatinine Ratio: 16 (ref 9–23)
BUN: 15 mg/dL (ref 6–24)
Bilirubin Total: 0.3 mg/dL (ref 0.0–1.2)
CO2: 28 mmol/L (ref 20–29)
Calcium: 10 mg/dL (ref 8.7–10.2)
Chloride: 94 mmol/L — ABNORMAL LOW (ref 96–106)
Creatinine, Ser: 0.96 mg/dL (ref 0.57–1.00)
GFR calc Af Amer: 78 mL/min/{1.73_m2} (ref >59)
GFR calc non Af Amer: 68 mL/min/{1.73_m2} (ref >59)
Globulin, Total: 2.3 g/dL (ref 1.5–4.5)
Glucose: 144 mg/dL — ABNORMAL HIGH (ref 65–99)
Potassium: 4.3 mmol/L (ref 3.5–5.2)
Sodium: 138 mmol/L (ref 134–144)
Total Protein: 7.1 g/dL (ref 6.0–8.5)

## 2019-04-02 LAB — CBC WITH DIFFERENTIAL/PLATELET
Basophils Absolute: 0 10*3/uL (ref 0.0–0.2)
Basos: 0 %
EOS (ABSOLUTE): 0.1 10*3/uL (ref 0.0–0.4)
Eos: 2 %
Hematocrit: 44.6 % (ref 34.0–46.6)
Hemoglobin: 15 g/dL (ref 11.1–15.9)
Immature Grans (Abs): 0 10*3/uL (ref 0.0–0.1)
Immature Granulocytes: 0 %
Lymphocytes Absolute: 2.3 10*3/uL (ref 0.7–3.1)
Lymphs: 30 %
MCH: 29.1 pg (ref 26.6–33.0)
MCHC: 33.6 g/dL (ref 31.5–35.7)
MCV: 86 fL (ref 79–97)
Monocytes Absolute: 0.3 10*3/uL (ref 0.1–0.9)
Monocytes: 5 %
Neutrophils Absolute: 4.8 10*3/uL (ref 1.4–7.0)
Neutrophils: 63 %
Platelets: 327 10*3/uL (ref 150–450)
RBC: 5.16 x10E6/uL (ref 3.77–5.28)
RDW: 13.5 % (ref 11.7–15.4)
WBC: 7.6 10*3/uL (ref 3.4–10.8)

## 2019-04-05 ENCOUNTER — Telehealth: Payer: Self-pay

## 2019-04-05 NOTE — Telephone Encounter (Signed)
FAXED [place]  [id number]

## 2019-06-29 ENCOUNTER — Ambulatory Visit: Payer: BC Managed Care – PPO | Admitting: Nurse Practitioner

## 2019-07-08 ENCOUNTER — Other Ambulatory Visit: Payer: Self-pay

## 2019-07-08 ENCOUNTER — Ambulatory Visit: Payer: BC Managed Care – PPO | Admitting: Nurse Practitioner

## 2019-07-08 ENCOUNTER — Encounter: Payer: Self-pay | Admitting: Nurse Practitioner

## 2019-07-08 VITALS — BP 140/99 | HR 81 | Temp 98.1°F | Resp 16 | Ht 69.0 in | Wt 206.4 lb

## 2019-07-08 DIAGNOSIS — R635 Abnormal weight gain: Secondary | ICD-10-CM

## 2019-07-08 DIAGNOSIS — R030 Elevated blood-pressure reading, without diagnosis of hypertension: Secondary | ICD-10-CM | POA: Diagnosis not present

## 2019-07-08 MED ORDER — HYDROCHLOROTHIAZIDE 12.5 MG PO TABS: 12.5000 mg | ORAL_TABLET | Freq: Every day | ORAL | 3 refills | Status: DC

## 2019-07-08 NOTE — Progress Notes (Signed)
[facility] [place], [place] [address]  Internal MEDICINE  Office Visit Note  Patient Name: Kasyn L Deatherage  [id number]  [id number]  Date of Service: 07/10/2019   .  Chief Complaint  Patient presents with  . Hypothyroidism    Patient presents for follow-up. She is concerned about eight pound weight gain over past several months. She has been on phentermine in the past to help lose weight. She has been successful. Today, her blood pressure is elevated. She denies chest pain, chest pain, or shortness of breath. She has taken HCTZ in the past. Has not taken it in some time because it makes her have to urinate frequently.        Current Medication: Outpatient Encounter Medications as of 07/08/2019  Medication Sig  . albuterol (PROVENTIL HFA;VENTOLIN HFA) 108 (90 Base) MCG/ACT inhaler Inhale into the lungs every 6 (six) hours as needed for wheezing or shortness of breath.  . famciclovir (FAMVIR) 500 MG tablet Take 1 tablet (500 mg total) by mouth 2 (two) times daily.  . hydrochlorothiazide (HYDRODIURIL) 12.5 MG tablet Take 1 tablet (12.5 mg total) by mouth daily.  Marland Kitchen HYDROcodone-acetaminophen (NORCO/VICODIN) 5-325 MG tablet TK 1 TO 2 TS PO Q 6 H PRF MODERATE PAIN. DO NOT TK WITH OTHER PAIN MEDS  . phentermine (ADIPEX-P) 37.5 MG tablet Take 1 tablet (37.5 mg total) by mouth daily before breakfast.  . [DISCONTINUED] hydrochlorothiazide (HYDRODIURIL) 12.5 MG tablet Take 1 tablet (12.5 mg total) by mouth daily.   No facility-administered encounter medications on file as of 07/08/2019.     Surgical History: Past Surgical History:  Procedure Laterality Date  . child birth     natural  . thigh surgery  10/22/2018    Medical History: Past Medical History:  Diagnosis Date  . Hypothyroidism   . Sarcoidosis     Family History: Family History  Problem Relation Age of Onset  . Hyperlipidemia Mother   . Hypertension Mother     Social History    Socioeconomic History  . Marital status: Married    Spouse name: Not on file  . Number of children: Not on file  . Years of education: Not on file  . Highest education level: Not on file  Occupational History  . Not on file  Social Needs  . Financial resource strain: Not on file  . Food insecurity    Worry: Not on file    Inability: Not on file  . Transportation needs    Medical: Not on file    Non-medical: Not on file  Tobacco Use  . Smoking status: Never Smoker  . Smokeless tobacco: Never Used  Substance and Sexual Activity  . Alcohol use: Yes    Comment: ocassionally  . Drug use: Never  . Sexual activity: Not on file  Lifestyle  . Physical activity    Days per week: Not on file    Minutes per session: Not on file  . Stress: Not on file  Relationships  . Social Herbalist on phone: Not on file    Gets together: Not on file    Attends religious service: Not on file    Active member of club or organization: Not on file    Attends meetings of clubs or organizations: Not on file    Relationship status: Not on file  . Intimate partner violence    Fear of current or ex partner: Not on file    Emotionally abused:  Not on file    Physically abused: Not on file    Forced sexual activity: Not on file  Other Topics Concern  . Not on file  Social History Narrative  . Not on file      Review of Systems  Constitutional: Positive for unexpected weight change. Negative for chills and fatigue.       Eight pound weight gain since her most recent visit .  HENT: Negative for congestion, postnasal drip, rhinorrhea, sneezing and sore throat.   Respiratory: Negative for cough, chest tightness, shortness of breath and wheezing.   Cardiovascular: Negative for chest pain and palpitations.       Elevated blood pressure today.  Gastrointestinal: Negative for abdominal pain, constipation, diarrhea, nausea and vomiting.  Endocrine: Negative for cold intolerance, heat  intolerance, polydipsia and polyuria.  Musculoskeletal: Negative for arthralgias, back pain, joint swelling and neck pain.  Skin: Negative for rash.  Neurological: Negative for dizziness, tremors, numbness and headaches.  Hematological: Negative for adenopathy. Does not bruise/bleed easily.  Psychiatric/Behavioral: Negative for behavioral problems (Depression), sleep disturbance and suicidal ideas. The patient is nervous/anxious.     Today's Vitals   07/08/19 1054 07/08/19 1145  BP: (!) 143/100 (!) 140/99  Pulse: 78 81  Resp: 16   Temp: 98.1 F (36.7 C)   SpO2: 99% 100%  Weight: 206 lb 6.4 oz (93.6 kg)   Height: 5\' 9"  (1.753 m)    Body mass index is 30.48 kg/m.  Physical Exam Vitals signs and nursing note reviewed.  Constitutional:      Appearance: Normal appearance. She is overweight.  HENT:     Head: Normocephalic and atraumatic.     Nose: Nose normal.  Eyes:     Pupils: Pupils are equal, round, and reactive to light.  Neck:     Musculoskeletal: Normal range of motion and neck supple. No muscular tenderness.  Cardiovascular:     Rate and Rhythm: Normal rate and regular rhythm.     Heart sounds: Normal heart sounds.  Pulmonary:     Effort: Pulmonary effort is normal.     Breath sounds: Normal breath sounds.  Abdominal:     Palpations: Abdomen is soft.  Skin:    General: Skin is warm and dry.  Neurological:     Mental Status: She is alert and oriented to person, place, and time.  Psychiatric:        Mood and Affect: Mood normal.        Behavior: Behavior normal.        Thought Content: Thought content normal.        Judgment: Judgment normal.    Assessment/Plan:  1. Elevated blood pressure reading Restart HCTZ 12.5mg  tablets daily. Advised her to limit intake of salt in the diet and increase water.  - hydrochlorothiazide (HYDRODIURIL) 12.5 MG tablet; Take 1 tablet (12.5 mg total) by mouth daily.  Dispense: 30 tablet; Refill: 3  2. Abnormal weight  gain Discussed limiting calorie intake to 1200-1500 calories per day and gradually incorporate exercise into the daily routine.    General Counseling: Jaleiyah verbalizes understanding of the findings of todays visit and agrees with plan of treatment. I have discussed any further diagnostic evaluation that may be needed or ordered today. We also reviewed her medications today. she has been encouraged to call the office with any questions or concerns that should arise related to todays visit.   Hypertension Counseling:   The following hypertensive lifestyle modification were recommended and discussed:  1. Limiting alcohol intake to less than 1 oz/day of ethanol:(24 oz of beer or 8 oz of wine or 2 oz of 100-proof whiskey). 2. Take baby ASA 81 mg daily. 3. Importance of regular aerobic exercise and losing weight. 4. Reduce dietary saturated fat and cholesterol intake for overall cardiovascular health. 5. Maintaining adequate dietary potassium, calcium, and magnesium intake. 6. Regular monitoring of the blood pressure. 7. Reduce sodium intake to less than 100 mmol/day (less than 2.3 gm of sodium or less than 6 gm of sodium choride)   This patient was seen by [place] with Dr Lavera Guise as a part of collaborative care agreement  Meds ordered this encounter  Medications  . hydrochlorothiazide (HYDRODIURIL) 12.5 MG tablet    Sig: Take 1 tablet (12.5 mg total) by mouth daily.    Dispense:  30 tablet    Refill:  3    Order Specific Question:   Supervising Provider    Answer:   Lavera Guise [id number]    Time spent: 60 Minutes      Dr Lavera Guise Internal medicine

## 2019-07-10 DIAGNOSIS — R635 Abnormal weight gain: Secondary | ICD-10-CM | POA: Insufficient documentation

## 2019-07-20 ENCOUNTER — Ambulatory Visit: Payer: BC Managed Care – PPO | Admitting: Adult Health

## 2019-07-20 ENCOUNTER — Encounter: Payer: Self-pay | Admitting: Adult Health

## 2019-07-20 ENCOUNTER — Other Ambulatory Visit: Payer: Self-pay

## 2019-07-20 VITALS — BP 138/88 | HR 77 | Temp 97.5°F | Resp 16 | Ht 69.0 in | Wt 206.0 lb

## 2019-07-20 DIAGNOSIS — R030 Elevated blood-pressure reading, without diagnosis of hypertension: Secondary | ICD-10-CM | POA: Diagnosis not present

## 2019-07-20 DIAGNOSIS — F439 Reaction to severe stress, unspecified: Secondary | ICD-10-CM | POA: Diagnosis not present

## 2019-07-20 DIAGNOSIS — R635 Abnormal weight gain: Secondary | ICD-10-CM | POA: Diagnosis not present

## 2019-07-20 NOTE — Progress Notes (Signed)
[facility] [place], [place] [address]  Internal MEDICINE  Office Visit Note  Patient Name: Susan Griffith  [id number]  [id number]  Date of Service: 07/20/2019  Chief Complaint  Patient presents with  . Hypothyroidism  . Follow-up    Bp    HPI  Pt is here for follow up blood pressure. She reports some issues recently with stress.  Her bp today 128/90.  She has expressed that she would like the continued opportunity to regulate her BP with diet and exercise.  She will continue to take her medication, she declined to increase her medication at this time.      Current Medication: Outpatient Encounter Medications as of 07/20/2019  Medication Sig  . albuterol (PROVENTIL HFA;VENTOLIN HFA) 108 (90 Base) MCG/ACT inhaler Inhale into the lungs every 6 (six) hours as needed for wheezing or shortness of breath.  . famciclovir (FAMVIR) 500 MG tablet Take 1 tablet (500 mg total) by mouth 2 (two) times daily.  . hydrochlorothiazide (HYDRODIURIL) 12.5 MG tablet Take 1 tablet (12.5 mg total) by mouth daily.  Marland Kitchen HYDROcodone-acetaminophen (NORCO/VICODIN) 5-325 MG tablet TK 1 TO 2 TS PO Q 6 H PRF MODERATE PAIN. DO NOT TK WITH OTHER PAIN MEDS  . phentermine (ADIPEX-P) 37.5 MG tablet Take 1 tablet (37.5 mg total) by mouth daily before breakfast.   No facility-administered encounter medications on file as of 07/20/2019.     Surgical History: Past Surgical History:  Procedure Laterality Date  . child birth     natural  . thigh surgery  10/22/2018    Medical History: Past Medical History:  Diagnosis Date  . Hypothyroidism   . Sarcoidosis     Family History: Family History  Problem Relation Age of Onset  . Hyperlipidemia Mother   . Hypertension Mother     Social History   Socioeconomic History  . Marital status: Married    Spouse name: Not on file  . Number of children: Not on file  . Years of education: Not on file  . Highest education level: Not on  file  Occupational History  . Not on file  Social Needs  . Financial resource strain: Not on file  . Food insecurity    Worry: Not on file    Inability: Not on file  . Transportation needs    Medical: Not on file    Non-medical: Not on file  Tobacco Use  . Smoking status: Never Smoker  . Smokeless tobacco: Never Used  Substance and Sexual Activity  . Alcohol use: Yes    Comment: ocassionally  . Drug use: Never  . Sexual activity: Not on file  Lifestyle  . Physical activity    Days per week: Not on file    Minutes per session: Not on file  . Stress: Not on file  Relationships  . Social Herbalist on phone: Not on file    Gets together: Not on file    Attends religious service: Not on file    Active member of club or organization: Not on file    Attends meetings of clubs or organizations: Not on file    Relationship status: Not on file  . Intimate partner violence    Fear of current or ex partner: Not on file    Emotionally abused: Not on file    Physically abused: Not on file    Forced sexual activity: Not on file  Other Topics Concern  . Not  on file  Social History Narrative  . Not on file      Review of Systems  Constitutional: Negative for chills, fatigue and unexpected weight change.  HENT: Negative for congestion, rhinorrhea, sneezing and sore throat.   Eyes: Negative for photophobia, pain and redness.  Respiratory: Negative for cough, chest tightness and shortness of breath.   Cardiovascular: Negative for chest pain and palpitations.  Gastrointestinal: Negative for abdominal pain, constipation, diarrhea, nausea and vomiting.  Endocrine: Negative.   Genitourinary: Negative for dysuria and frequency.  Musculoskeletal: Negative for arthralgias, back pain, joint swelling and neck pain.  Skin: Negative for rash.  Allergic/Immunologic: Negative.   Neurological: Negative for tremors and numbness.  Hematological: Negative for adenopathy. Does not  bruise/bleed easily.  Psychiatric/Behavioral: Negative for behavioral problems and sleep disturbance. The patient is not nervous/anxious.     Vital Signs: BP 128/90   Pulse 77   Temp (!) 97.5 F (36.4 C)   Resp 16   Ht 5\' 9"  (1.753 m)   Wt 206 lb (93.4 kg)   SpO2 97%   BMI 30.42 kg/m    Physical Exam Vitals signs and nursing note reviewed.  Constitutional:      General: She is not in acute distress.    Appearance: She is well-developed. She is not diaphoretic.  HENT:     Head: Normocephalic and atraumatic.     Mouth/Throat:     Pharynx: No oropharyngeal exudate.  Eyes:     Pupils: Pupils are equal, round, and reactive to light.  Neck:     Musculoskeletal: Normal range of motion and neck supple.     Thyroid: No thyromegaly.     Vascular: No JVD.     Trachea: No tracheal deviation.  Cardiovascular:     Rate and Rhythm: Normal rate and regular rhythm.     Heart sounds: Normal heart sounds. No murmur. No friction rub. No gallop.   Pulmonary:     Effort: Pulmonary effort is normal. No respiratory distress.     Breath sounds: Normal breath sounds. No wheezing or rales.  Chest:     Chest wall: No tenderness.  Abdominal:     Palpations: Abdomen is soft.     Tenderness: There is no abdominal tenderness. There is no guarding.  Musculoskeletal: Normal range of motion.  Lymphadenopathy:     Cervical: No cervical adenopathy.  Skin:    General: Skin is warm and dry.  Neurological:     Mental Status: She is alert and oriented to person, place, and time.     Cranial Nerves: No cranial nerve deficit.  Psychiatric:        Behavior: Behavior normal.        Thought Content: Thought content normal.        Judgment: Judgment normal.    Assessment/Plan: 1. Elevated blood pressure reading Diastolic remains borderline elevated.  She will continue to exercise, and take her medications at this time.  Follow up in office in weeks.   2. Stress Pt reports multiple stressors in her  life at this time and feels they are contributing to her elevated BP, She feels like she is coping as best she can currently.   3. Abnormal weight gain Continues to be concerned about weight gain, and would like to restart her phentermine.  She continues to wait for her blood pressure to normalize.   General Counseling: Dewana verbalizes understanding of the findings of todays visit and agrees with plan of treatment. I have  discussed any further diagnostic evaluation that may be needed or ordered today. We also reviewed her medications today. she has been encouraged to call the office with any questions or concerns that should arise related to todays visit.    No orders of the defined types were placed in this encounter.   No orders of the defined types were placed in this encounter.   Time spent: 25 Minutes   This patient was seen by Orson Gear AGNP-C in Collaboration with Dr Lavera Guise as a part of collaborative care agreement     Kendell Bane AGNP-C Internal medicine

## 2019-08-02 ENCOUNTER — Telehealth: Payer: Self-pay

## 2019-08-02 NOTE — Telephone Encounter (Signed)
SCREENED AND CONFIRMED 08-04-19 OV

## 2019-08-04 ENCOUNTER — Ambulatory Visit: Payer: BC Managed Care – PPO | Admitting: Nurse Practitioner

## 2019-08-04 ENCOUNTER — Encounter: Payer: Self-pay | Admitting: Nurse Practitioner

## 2019-08-04 ENCOUNTER — Other Ambulatory Visit: Payer: Self-pay

## 2019-08-04 ENCOUNTER — Ambulatory Visit: Payer: BC Managed Care – PPO | Admitting: Adult Health

## 2019-08-04 VITALS — BP 124/92 | HR 79 | Temp 97.6°F | Resp 16 | Ht 69.0 in | Wt 208.4 lb

## 2019-08-04 DIAGNOSIS — I10 Essential (primary) hypertension: Secondary | ICD-10-CM | POA: Diagnosis not present

## 2019-08-04 DIAGNOSIS — Z6830 Body mass index (BMI) 30.0-30.9, adult: Secondary | ICD-10-CM

## 2019-08-04 DIAGNOSIS — R030 Elevated blood-pressure reading, without diagnosis of hypertension: Secondary | ICD-10-CM

## 2019-08-04 DIAGNOSIS — E039 Hypothyroidism, unspecified: Secondary | ICD-10-CM

## 2019-08-04 DIAGNOSIS — E669 Obesity, unspecified: Secondary | ICD-10-CM | POA: Diagnosis not present

## 2019-08-04 DIAGNOSIS — Z411 Encounter for cosmetic surgery: Secondary | ICD-10-CM

## 2019-08-04 MED ORDER — HYDROCHLOROTHIAZIDE 12.5 MG PO TABS: 12.5000 mg | ORAL_TABLET | Freq: Every day | ORAL | 3 refills | Status: DC

## 2019-08-04 MED ORDER — PHENTERMINE HCL 37.5 MG PO TABS: 37.5000 mg | ORAL_TABLET | Freq: Every day | ORAL | 0 refills | Status: DC

## 2019-08-04 NOTE — Progress Notes (Signed)
[facility] [place], [place] [address]  Internal MEDICINE  Office Visit Note  Patient Name: Susan Griffith  [id number]  [id number]  Date of Service: 08/04/2019  Chief Complaint  Patient presents with  . Hypertension    The patient is here for follow up blood pressure. Was started on HCTZ 12.5mg  everyday rather than just as needed. Blood pressure is improved as is heart rate. She does report increased frequency of urination. She is waking up to urinate during the night. She denies other negative side effects. She is resting better. No longer taking sleep aid. She has had two pound weight gain. Still wanting to go back on phentermine to help curb appetite so she can lose weight. Has done well on this in the past .      Current Medication: Outpatient Encounter Medications as of 08/04/2019  Medication Sig  . albuterol (PROVENTIL HFA;VENTOLIN HFA) 108 (90 Base) MCG/ACT inhaler Inhale into the lungs every 6 (six) hours as needed for wheezing or shortness of breath.  . famciclovir (FAMVIR) 500 MG tablet Take 1 tablet (500 mg total) by mouth 2 (two) times daily.  . hydrochlorothiazide (HYDRODIURIL) 12.5 MG tablet Take 1 tablet (12.5 mg total) by mouth daily.  Marland Kitchen HYDROcodone-acetaminophen (NORCO/VICODIN) 5-325 MG tablet TK 1 TO 2 TS PO Q 6 H PRF MODERATE PAIN. DO NOT TK WITH OTHER PAIN MEDS  . phentermine (ADIPEX-P) 37.5 MG tablet Take 1 tablet (37.5 mg total) by mouth daily before breakfast.  . [DISCONTINUED] hydrochlorothiazide (HYDRODIURIL) 12.5 MG tablet Take 1 tablet (12.5 mg total) by mouth daily.  . [DISCONTINUED] phentermine (ADIPEX-P) 37.5 MG tablet Take 1 tablet (37.5 mg total) by mouth daily before breakfast.   No facility-administered encounter medications on file as of 08/04/2019.     Surgical History: Past Surgical History:  Procedure Laterality Date  . child birth     natural  . thigh surgery  10/22/2018    Medical History: Past Medical  History:  Diagnosis Date  . Hypothyroidism   . Sarcoidosis     Family History: Family History  Problem Relation Age of Onset  . Hyperlipidemia Mother   . Hypertension Mother     Social History   Socioeconomic History  . Marital status: Married    Spouse name: Not on file  . Number of children: Not on file  . Years of education: Not on file  . Highest education level: Not on file  Occupational History  . Not on file  Social Needs  . Financial resource strain: Not on file  . Food insecurity    Worry: Not on file    Inability: Not on file  . Transportation needs    Medical: Not on file    Non-medical: Not on file  Tobacco Use  . Smoking status: Never Smoker  . Smokeless tobacco: Never Used  Substance and Sexual Activity  . Alcohol use: Yes    Comment: ocassionally  . Drug use: Never  . Sexual activity: Not on file  Lifestyle  . Physical activity    Days per week: Not on file    Minutes per session: Not on file  . Stress: Not on file  Relationships  . Social Herbalist on phone: Not on file    Gets together: Not on file    Attends religious service: Not on file    Active member of club or organization: Not on file    Attends meetings of clubs  or organizations: Not on file    Relationship status: Not on file  . Intimate partner violence    Fear of current or ex partner: Not on file    Emotionally abused: Not on file    Physically abused: Not on file    Forced sexual activity: Not on file  Other Topics Concern  . Not on file  Social History Narrative  . Not on file      Review of Systems  Constitutional: Positive for unexpected weight change. Negative for chills and fatigue.       Two pound weight gain since she was last seen.   HENT: Negative for congestion, postnasal drip, rhinorrhea, sneezing and sore throat.   Respiratory: Negative for cough, chest tightness, shortness of breath and wheezing.   Cardiovascular: Negative for chest pain and  palpitations.       Elevated blood pressure today.  Gastrointestinal: Negative for abdominal pain, constipation, diarrhea, nausea and vomiting.  Endocrine: Negative for cold intolerance, heat intolerance, polydipsia and polyuria.  Musculoskeletal: Negative for arthralgias, back pain, joint swelling and neck pain.  Skin: Negative for rash.  Neurological: Negative for dizziness, tremors, numbness and headaches.  Hematological: Negative for adenopathy. Does not bruise/bleed easily.  Psychiatric/Behavioral: Negative for behavioral problems (Depression), sleep disturbance and suicidal ideas. The patient is nervous/anxious.     Today's Vitals   08/04/19 0831  BP: (!) 124/92  Pulse: 79  Resp: 16  Temp: 97.6 F (36.4 C)  SpO2: 99%  Weight: 208 lb 6.4 oz (94.5 kg)  Height: 5\' 9"  (1.753 m)   Body mass index is 30.78 kg/m.  Physical Exam Vitals signs and nursing note reviewed.  Constitutional:      Appearance: Normal appearance. She is overweight.  HENT:     Head: Normocephalic and atraumatic.     Nose: Nose normal.  Eyes:     Pupils: Pupils are equal, round, and reactive to light.  Neck:     Musculoskeletal: Normal range of motion and neck supple. No muscular tenderness.  Cardiovascular:     Rate and Rhythm: Normal rate and regular rhythm.     Heart sounds: Normal heart sounds.  Pulmonary:     Effort: Pulmonary effort is normal.     Breath sounds: Normal breath sounds.  Abdominal:     Palpations: Abdomen is soft.  Skin:    General: Skin is warm and dry.  Neurological:     Mental Status: She is alert and oriented to person, place, and time.  Psychiatric:        Mood and Affect: Mood normal.        Behavior: Behavior normal.        Thought Content: Thought content normal.        Judgment: Judgment normal.   Assessment/Plan: 1. Benign hypertension Stable. Continue bp medication HCTZ 12.5mg  daily.   hydrochlorothiazide (HYDRODIURIL) 12.5 MG tablet; Take 1 tablet (12.5 mg  total) by mouth daily.  Dispense: 30 tablet; Refill: 3  2. Acquired hypothyroidism Stable.   3. Body mass index 30.0-30.9, adult Restart phentermine everyday. Limit calorie intake to 1200-1500 calories per day. Gradually incorporate exercise into daily routine.  - phentermine (ADIPEX-P) 37.5 MG tablet; Take 1 tablet (37.5 mg total) by mouth daily before breakfast.  Dispense: 30 tablet; Refill: 0  Hypertension Counseling:   The following hypertensive lifestyle modification were recommended and discussed:  1. Limiting alcohol intake to less than 1 oz/day of ethanol:(24 oz of beer or 8 oz of  wine or 2 oz of 100-proof whiskey). 2. Take baby ASA 81 mg daily. 3. Importance of regular aerobic exercise and losing weight. 4. Reduce dietary saturated fat and cholesterol intake for overall cardiovascular health. 5. Maintaining adequate dietary potassium, calcium, and magnesium intake. 6. Regular monitoring of the blood pressure. 7. Reduce sodium intake to less than 100 mmol/day (less than 2.3 gm of sodium or less than 6 gm of sodium choride)    There is a liability release in patients' chart. There has been a 10 minute discussion about the side effects including but not limited to elevated blood pressure, anxiety, lack of sleep and dry mouth. Pt understands and will like to start/continue on appetite suppressant at this time. There will be one month RX given at the time of visit with proper follow up. Nova diet plan with restricted calories is given to the pt. Pt understands and agrees with  plan of treatment  This patient was seen by Leretha Pol FNP Collaboration with Dr Lavera Guise as a part of collaborative care agreement   General Counseling: Karrina verbalizes understanding of the findings of todays visit and agrees with plan of treatment. I have discussed any further diagnostic evaluation that may be needed or ordered today. We also reviewed her medications today. she has been encouraged to  call the office with any questions or concerns that should arise related to todays visit.     Meds ordered this encounter  Medications  . phentermine (ADIPEX-P) 37.5 MG tablet    Sig: Take 1 tablet (37.5 mg total) by mouth daily before breakfast.    Dispense:  30 tablet    Refill:  0    Order Specific Question:   Supervising Provider    Answer:   Lavera Guise [place]  . hydrochlorothiazide (HYDRODIURIL) 12.5 MG tablet    Sig: Take 1 tablet (12.5 mg total) by mouth daily.    Dispense:  30 tablet    Refill:  3    Order Specific Question:   Supervising Provider    Answer:   Lavera Guise [id number]    Time spent: 36 Minutes      Dr Lavera Guise Internal medicine

## 2019-08-11 ENCOUNTER — Other Ambulatory Visit: Payer: Self-pay

## 2019-08-11 NOTE — Progress Notes (Signed)
Patient treated with Botox Cosmetic/ OnabotulinumtoxinA  Risks and Benefits explained to patient, Written consent obtained and signed.   Lot#: [id number] Exp: 01/2022  Glabellar: 20 Units Frontalis: 20 units Crows Feet: 0 Units Other: 0 Units  Total: 40 Units  Aftercare instructions given to patient.  Patient denied any questions.  Will follow up in 14 days for results review.

## 2019-08-26 ENCOUNTER — Ambulatory Visit: Payer: BC Managed Care – PPO | Admitting: Nurse Practitioner

## 2019-08-26 ENCOUNTER — Encounter: Payer: Self-pay | Admitting: Nurse Practitioner

## 2019-08-26 ENCOUNTER — Other Ambulatory Visit: Payer: Self-pay

## 2019-08-26 DIAGNOSIS — J4521 Mild intermittent asthma with (acute) exacerbation: Secondary | ICD-10-CM | POA: Diagnosis not present

## 2019-08-26 DIAGNOSIS — Z20828 Contact with and (suspected) exposure to other viral communicable diseases: Secondary | ICD-10-CM | POA: Diagnosis not present

## 2019-08-26 DIAGNOSIS — J069 Acute upper respiratory infection, unspecified: Secondary | ICD-10-CM | POA: Insufficient documentation

## 2019-08-26 DIAGNOSIS — R059 Cough, unspecified: Secondary | ICD-10-CM | POA: Insufficient documentation

## 2019-08-26 DIAGNOSIS — R05 Cough: Secondary | ICD-10-CM | POA: Diagnosis not present

## 2019-08-26 DIAGNOSIS — Z20822 Contact with and (suspected) exposure to covid-19: Secondary | ICD-10-CM

## 2019-08-26 MED ORDER — ALBUTEROL SULFATE HFA 108 (90 BASE) MCG/ACT IN AERS: 2.0000 | INHALATION_SPRAY | Freq: Four times a day (QID) | RESPIRATORY_TRACT | 2 refills | Status: DC | PRN

## 2019-08-26 MED ORDER — PREDNISONE 10 MG (21) PO TBPK: ORAL_TABLET | ORAL | 0 refills | Status: DC

## 2019-08-26 MED ORDER — AZITHROMYCIN 250 MG PO TABS: ORAL_TABLET | ORAL | 0 refills | Status: DC

## 2019-08-26 NOTE — Progress Notes (Signed)
[facility], [place] [address]  Internal MEDICINE  Telephone Visit  Patient Name: Susan Griffith  [id number]  [id number]  Date of Service: 08/26/2019  I connected with the patient at 3:03pm by webcam and verified the patients identity using two identifiers.   I discussed the limitations, risks, security and privacy concerns of performing an evaluation and management service by webcam and the availability of in person appointments. I also discussed with the patient that there may be a patient responsible charge related to the service.  The patient expressed understanding and agrees to proceed.    Chief Complaint  Patient presents with  . Telephone Assessment    covid exposure  . Telephone Screen  . Cough  . Nasal Congestion    The patient has been contacted via webcam for follow up visit due to concerns for spread of novel coronavirus. The patient presents for acute visit. She has had cough with post nasal drip with deep, congested cough. Symptoms present for past 1.5 weeks. Cough gradually getting worse. She denies fever. Her husband and one of her sons tested positive for COVID 19. She has gone for testing earlier today. Has been self-isolating since her husband's test came back positive.       Current Medication: Outpatient Encounter Medications as of 08/26/2019  Medication Sig  . albuterol (VENTOLIN HFA) 108 (90 Base) MCG/ACT inhaler Inhale 2 puffs into the lungs every 6 (six) hours as needed for wheezing or shortness of breath.  . famciclovir (FAMVIR) 500 MG tablet Take 1 tablet (500 mg total) by mouth 2 (two) times daily.  . hydrochlorothiazide (HYDRODIURIL) 12.5 MG tablet Take 1 tablet (12.5 mg total) by mouth daily.  Marland Kitchen HYDROcodone-acetaminophen (NORCO/VICODIN) 5-325 MG tablet TK 1 TO 2 TS PO Q 6 H PRF MODERATE PAIN. DO NOT TK WITH OTHER PAIN MEDS  . phentermine (ADIPEX-P) 37.5 MG tablet Take 1 tablet (37.5 mg total) by mouth daily before  breakfast.  . [DISCONTINUED] albuterol (PROVENTIL HFA;VENTOLIN HFA) 108 (90 Base) MCG/ACT inhaler Inhale into the lungs every 6 (six) hours as needed for wheezing or shortness of breath.  Marland Kitchen azithromycin (ZITHROMAX) 250 MG tablet z-pack - take as directed for 5 days for acute upper respiratory infection  . predniSONE (STERAPRED UNI-PAK 21 TAB) 10 MG (21) TBPK tablet 6 day taper - take by mouth as directed for 6 days   No facility-administered encounter medications on file as of 08/26/2019.    Surgical History: Past Surgical History:  Procedure Laterality Date  . child birth     natural  . thigh surgery  10/22/2018    Medical History: Past Medical History:  Diagnosis Date  . Hypothyroidism   . Sarcoidosis     Family History: Family History  Problem Relation Age of Onset  . Hyperlipidemia Mother   . Hypertension Mother     Social History   Socioeconomic History  . Marital status: Married    Spouse name: Not on file  . Number of children: Not on file  . Years of education: Not on file  . Highest education level: Not on file  Occupational History  . Not on file  Tobacco Use  . Smoking status: Never Smoker  . Smokeless tobacco: Never Used  Substance and Sexual Activity  . Alcohol use: Yes    Comment: ocassionally  . Drug use: Never  . Sexual activity: Not on file  Other Topics Concern  . Not on file  Social History Narrative  .  Not on file   Social Determinants of Health   Financial Resource Strain:   . Difficulty of Paying Living Expenses: Not on file  Food Insecurity:   . Worried About Charity fundraiser in the Last Year: Not on file  . Ran Out of Food in the Last Year: Not on file  Transportation Needs:   . Lack of Transportation (Medical): Not on file  . Lack of Transportation (Non-Medical): Not on file  Physical Activity:   . Days of Exercise per Week: Not on file  . Minutes of Exercise per Session: Not on file  Stress:   . Feeling of Stress : Not on  file  Social Connections:   . Frequency of Communication with Friends and Family: Not on file  . Frequency of Social Gatherings with Friends and Family: Not on file  . Attends Religious Services: Not on file  . Active Member of Clubs or Organizations: Not on file  . Attends Archivist Meetings: Not on file  . Marital Status: Not on file  Intimate Partner Violence:   . Fear of Current or Ex-Partner: Not on file  . Emotionally Abused: Not on file  . Physically Abused: Not on file  . Sexually Abused: Not on file      Review of Systems  Constitutional: Positive for fatigue. Negative for chills and fever.  HENT: Positive for congestion, postnasal drip, rhinorrhea and sore throat.   Respiratory: Positive for cough, shortness of breath and wheezing.   Cardiovascular: Negative for chest pain and palpitations.  Gastrointestinal: Negative for constipation, diarrhea, nausea and vomiting.  Musculoskeletal: Negative for arthralgias and myalgias.  Allergic/Immunologic: Positive for environmental allergies.  Neurological: Positive for headaches. Negative for dizziness.  Hematological: Negative for adenopathy.    Vital Signs: There were no vitals taken for this visit.   Observation/Objective:   The patient is alert and oriented. She is pleasant and answers all questions appropriately. Breathing is non-labored. She is in no acute distress at this time. The patient is nasally congested. She appears to feel poorly.    Assessment/Plan: 1. Acute upper respiratory infection Start z-pack. Take as directed for 5 days. Rest and increase fluids. Use OTC medications as needed and as indicated to treat acute symptoms.  - azithromycin (ZITHROMAX) 250 MG tablet; z-pack - take as directed for 5 days for acute upper respiratory infection  Dispense: 6 tablet; Refill: 0  2. Mild intermittent asthma with (acute) exacerbation Add prednisone taper. Take as directed for 6 days. Rescue inhaler may be  used as needed and as prescribed.  - predniSONE (STERAPRED UNI-PAK 21 TAB) 10 MG (21) TBPK tablet; 6 day taper - take by mouth as directed for 6 days  Dispense: 21 tablet; Refill: 0 - albuterol (VENTOLIN HFA) 108 (90 Base) MCG/ACT inhaler; Inhale 2 puffs into the lungs every 6 (six) hours as needed for wheezing or shortness of breath.  Dispense: 18 g; Refill: 2  3. Cough Add prednisone taper. Take as directed for 6 days. Rescue inhaler may be used as needed and as prescribed.  - predniSONE (STERAPRED UNI-PAK 21 TAB) 10 MG (21) TBPK tablet; 6 day taper - take by mouth as directed for 6 days  Dispense: 21 tablet; Refill: 0 - albuterol (VENTOLIN HFA) 108 (90 Base) MCG/ACT inhaler; Inhale 2 puffs into the lungs every 6 (six) hours as needed for wheezing or shortness of breath.  Dispense: 18 g; Refill: 2  4. Exposure to COVID-19 virus Testing done today at  [facility]. Awaiting results.   General Counseling: Rayssa verbalizes understanding of the findings of today's phone visit and agrees with plan of treatment. I have discussed any further diagnostic evaluation that may be needed or ordered today. We also reviewed her medications today. she has been encouraged to call the office with any questions or concerns that should arise related to todays visit.     Person Under Monitoring Name: Susan Griffith  Location: [place] [address]   Infection Prevention Recommendations for Individuals Confirmed to have, or Being Evaluated for, 2019 Novel Coronavirus (COVID-19) Infection Who Receive Care at Home  Individuals who are confirmed to have, or are being evaluated for, COVID-19 should follow the prevention steps below until a healthcare provider or local or state health department says they can return to normal activities.  Stay home except to get medical care You should restrict activities outside your home, except for getting medical care. Do not go to work, school, or public  areas, and do not use public transportation or taxis.  Call ahead before visiting your doctor Before your medical appointment, call the healthcare provider and tell them that you have, or are being evaluated for, COVID-19 infection. This will help the healthcare provider's office take steps to keep other people from getting infected. Ask your healthcare provider to call the local or state health department.  Monitor your symptoms Seek prompt medical attention if your illness is worsening (e.g., difficulty breathing). Before going to your medical appointment, call the healthcare provider and tell them that you have, or are being evaluated for, COVID-19 infection. Ask your healthcare provider to call the local or state health department.  Wear a facemask You should wear a facemask that covers your nose and mouth when you are in the same room with other people and when you visit a healthcare provider. People who live with or visit you should also wear a facemask while they are in the same room with you.  Separate yourself from other people in your home As much as possible, you should stay in a different room from other people in your home. Also, you should use a separate bathroom, if available.  Avoid sharing household items You should not share dishes, drinking glasses, cups, eating utensils, towels, bedding, or other items with other people in your home. After using these items, you should wash them thoroughly with soap and water.  Cover your coughs and sneezes Cover your mouth and nose with a tissue when you cough or sneeze, or you can cough or sneeze into your sleeve. Throw used tissues in a lined trash can, and immediately wash your hands with soap and water for at least 20 seconds or use an alcohol-based hand rub.  Wash your [facility] your hands often and thoroughly with soap and water for at least 20 seconds. You can use an alcohol-based hand sanitizer if soap and water are not  available and if your hands are not visibly dirty. Avoid touching your eyes, nose, and mouth with unwashed hands.   Prevention Steps for Caregivers and Household Members of Individuals Confirmed to have, or Being Evaluated for, COVID-19 Infection Being Cared for in the Home  If you live with, or provide care at home for, a person confirmed to have, or being evaluated for, COVID-19 infection please follow these guidelines to prevent infection:  Follow healthcare provider's instructions Make sure that you understand and can help the patient follow any healthcare provider instructions for all  care.  Provide for the patient's basic needs You should help the patient with basic needs in the home and provide support for getting groceries, prescriptions, and other personal needs.  Monitor the patient's symptoms If they are getting sicker, call his or her medical provider and tell them that the patient has, or is being evaluated for, COVID-19 infection. This will help the healthcare provider's office take steps to keep other people from getting infected. Ask the healthcare provider to call the local or state health department.  Limit the number of people who have contact with the patient  If possible, have only one caregiver for the patient.  Other household members should stay in another home or place of residence. If this is not possible, they should stay  in another room, or be separated from the patient as much as possible. Use a separate bathroom, if available.  Restrict visitors who do not have an essential need to be in the home.  Keep older adults, very young children, and other sick people away from the patient Keep older adults, very young children, and those who have compromised immune systems or chronic health conditions away from the patient. This includes people with chronic heart, lung, or kidney conditions, diabetes, and cancer.  Ensure good ventilation Make sure that  shared spaces in the home have good air flow, such as from an air conditioner or an opened window, weather permitting.  Wash your hands often  Wash your hands often and thoroughly with soap and water for at least 20 seconds. You can use an alcohol based hand sanitizer if soap and water are not available and if your hands are not visibly dirty.  Avoid touching your eyes, nose, and mouth with unwashed hands.  Use disposable paper towels to dry your hands. If not available, use dedicated cloth towels and replace them when they become wet.  Wear a facemask and gloves  Wear a disposable facemask at all times in the room and gloves when you touch or have contact with the patient's blood, body fluids, and/or secretions or excretions, such as sweat, saliva, sputum, nasal mucus, vomit, urine, or feces.  Ensure the mask fits over your nose and mouth tightly, and do not touch it during use.  Throw out disposable facemasks and gloves after using them. Do not reuse.  Wash your hands immediately after removing your facemask and gloves.  If your personal clothing becomes contaminated, carefully remove clothing and launder. Wash your hands after handling contaminated clothing.  Place all used disposable facemasks, gloves, and other waste in a lined container before disposing them with other household waste.  Remove gloves and wash your hands immediately after handling these items.  Do not share dishes, glasses, or other household items with the patient  Avoid sharing household items. You should not share dishes, drinking glasses, cups, eating utensils, towels, bedding, or other items with a patient who is confirmed to have, or being evaluated for, COVID-19 infection.  After the person uses these items, you should wash them thoroughly with soap and water.  Wash laundry thoroughly  Immediately remove and wash clothes or bedding that have blood, body fluids, and/or secretions or excretions, such as  sweat, saliva, sputum, nasal mucus, vomit, urine, or feces, on them.  Wear gloves when handling laundry from the patient.  Read and follow directions on labels of laundry or clothing items and detergent. In general, wash and dry with the warmest temperatures recommended on the label.  Clean all areas  the individual has used often  Clean all touchable surfaces, such as counters, tabletops, doorknobs, bathroom fixtures, toilets, phones, keyboards, tablets, and bedside tables, every day. Also, clean any surfaces that may have blood, body fluids, and/or secretions or excretions on them.  Wear gloves when cleaning surfaces the patient has come in contact with.  Use a diluted bleach solution (e.g., dilute bleach with 1 part bleach and 10 parts water) or a household disinfectant with a label that says EPA-registered for coronaviruses. To make a bleach solution at home, add 1 tablespoon of bleach to 1 quart (4 cups) of water. For a larger supply, add  cup of bleach to 1 gallon (16 cups) of water.  Read labels of cleaning products and follow recommendations provided on product labels. Labels contain instructions for safe and effective use of the cleaning product including precautions you should take when applying the product, such as wearing gloves or eye protection and making sure you have good ventilation during use of the product.  Remove gloves and wash hands immediately after cleaning.  Monitor yourself for signs and symptoms of illness Caregivers and household members are considered close contacts, should monitor their health, and will be asked to limit movement outside of the home to the extent possible. Follow the monitoring steps for close contacts listed on the symptom monitoring form.   ? If you have additional questions, contact your local health department or call the epidemiologist on call at [phone] (available 24/7). ? This guidance is subject to change. For the most up-to-date  guidance from [facility], please refer to their website: YouBlogs.pl  This patient was seen by Leretha Pol FNP Collaboration with Dr Lavera Guise as a part of collaborative care agreement  Meds ordered this encounter  Medications  . predniSONE (STERAPRED UNI-PAK 21 TAB) 10 MG (21) TBPK tablet    Sig: 6 day taper - take by mouth as directed for 6 days    Dispense:  21 tablet    Refill:  0    Order Specific Question:   Supervising Provider    Answer:   Lavera Guise [place]  . azithromycin (ZITHROMAX) 250 MG tablet    Sig: z-pack - take as directed for 5 days for acute upper respiratory infection    Dispense:  6 tablet    Refill:  0    Order Specific Question:   Supervising Provider    Answer:   Lavera Guise [place]  . albuterol (VENTOLIN HFA) 108 (90 Base) MCG/ACT inhaler    Sig: Inhale 2 puffs into the lungs every 6 (six) hours as needed for wheezing or shortness of breath.    Dispense:  18 g    Refill:  2    Order Specific Question:   Supervising Provider    Answer:   Lavera Guise [id number]    Time spent: 9 Minutes    Dr Lavera Guise Internal medicine

## 2019-08-30 ENCOUNTER — Telehealth: Payer: Self-pay

## 2019-08-30 ENCOUNTER — Other Ambulatory Visit: Payer: Self-pay | Admitting: Nurse Practitioner

## 2019-08-30 DIAGNOSIS — R059 Cough, unspecified: Secondary | ICD-10-CM

## 2019-08-30 DIAGNOSIS — R05 Cough: Secondary | ICD-10-CM

## 2019-08-30 MED ORDER — HYDROCOD POLST-CPM POLST ER 10-8 MG/5ML PO SUER: 5.0000 mL | Freq: Two times a day (BID) | ORAL | 0 refills | Status: DC | PRN

## 2019-08-30 MED ORDER — BENZONATATE 200 MG PO CAPS: 200.0000 mg | ORAL_CAPSULE | Freq: Two times a day (BID) | ORAL | 0 refills | Status: DC | PRN

## 2019-08-30 NOTE — Telephone Encounter (Signed)
Sent prescription for tessalon perls which can be taken up to three times daily as needed for cough. This should not make her sleepy. Added tussionex. This may be taken twice daily if needed for cough. This should be taken at night and only if she is not driving or working. This will make her sleepy. Both sent to [facility].

## 2019-08-30 NOTE — Progress Notes (Signed)
Sent prescription for tessalon perls which can be taken up to three times daily as needed for cough. This should not make her sleepy. Added tussionex. This may be taken twice daily if needed for cough. This should be taken at night and only if she is not driving or working. This will make her sleepy. Both sent to [facility].

## 2019-08-30 NOTE — Telephone Encounter (Signed)
Pt advised we send  Tessalon perls and tussionex please don't take when driving or working

## 2019-09-01 ENCOUNTER — Ambulatory Visit: Payer: BC Managed Care – PPO | Admitting: Nurse Practitioner

## 2019-10-29 ENCOUNTER — Other Ambulatory Visit: Payer: Self-pay

## 2019-10-29 DIAGNOSIS — R030 Elevated blood-pressure reading, without diagnosis of hypertension: Secondary | ICD-10-CM

## 2019-10-29 MED ORDER — HYDROCHLOROTHIAZIDE 12.5 MG PO TABS: 12.5000 mg | ORAL_TABLET | Freq: Every day | ORAL | 3 refills | Status: DC

## 2019-10-29 MED ORDER — IVERMECTIN 3 MG PO TABS: ORAL_TABLET | ORAL | 0 refills | Status: DC

## 2019-10-29 NOTE — Telephone Encounter (Signed)
As per dr Humphrey Rolls send  ivermectin 3 mg 1 tab po daily for 3 days #3

## 2020-01-13 ENCOUNTER — Other Ambulatory Visit: Payer: Self-pay

## 2020-01-13 ENCOUNTER — Ambulatory Visit (INDEPENDENT_AMBULATORY_CARE_PROVIDER_SITE_OTHER): Payer: BC Managed Care – PPO | Admitting: Internal Medicine

## 2020-01-13 VITALS — BP 140/90 | HR 76

## 2020-01-13 DIAGNOSIS — R030 Elevated blood-pressure reading, without diagnosis of hypertension: Secondary | ICD-10-CM

## 2020-01-13 DIAGNOSIS — E669 Obesity, unspecified: Secondary | ICD-10-CM | POA: Diagnosis not present

## 2020-01-13 MED ORDER — PHENTERMINE HCL 15 MG PO CAPS: ORAL_CAPSULE | ORAL | 0 refills | Status: DC

## 2020-01-13 MED ORDER — TOPIRAMATE 25 MG PO TABS: 25.0000 mg | ORAL_TABLET | Freq: Two times a day (BID) | ORAL | 2 refills | Status: DC

## 2020-01-13 NOTE — Progress Notes (Addendum)
[facility] [place], [place] [address]  Internal MEDICINE  Office Visit Note  Patient Name: Susan Griffith  [id number]  [id number]  Date of Service: 01/17/2020  Chief Complaint  Patient presents with  . Follow-up  . Hypertension    HPI Pt is here for a follow up visit. bp has been under good control at home. Will like to start weight loss program. No fever or chills, she is cooking at home mostly, she is active however dos not have a formal exercise program  She is here for botox and dermal filler as well, Current Medication:  Outpatient Encounter Medications as of 01/13/2020  Medication Sig  . albuterol (VENTOLIN HFA) 108 (90 Base) MCG/ACT inhaler Inhale 2 puffs into the lungs every 6 (six) hours as needed for wheezing or shortness of breath.  Marland Kitchen azithromycin (ZITHROMAX) 250 MG tablet z-pack - take as directed for 5 days for acute upper respiratory infection  . benzonatate (TESSALON) 200 MG capsule Take 1 capsule (200 mg total) by mouth 2 (two) times daily as needed for cough.  . chlorpheniramine-HYDROcodone (TUSSIONEX PENNKINETIC ER) 10-8 MG/5ML SUER Take 5 mLs by mouth every 12 (twelve) hours as needed for cough.  . famciclovir (FAMVIR) 500 MG tablet Take 1 tablet (500 mg total) by mouth 2 (two) times daily.  . hydrochlorothiazide (HYDRODIURIL) 12.5 MG tablet Take 1 tablet (12.5 mg total) by mouth daily.  Marland Kitchen ivermectin (STROMECTOL) 3 MG TABS tablet Take 1 tab po for 3 days  . phentermine 15 MG capsule Take one tab po bid  . predniSONE (STERAPRED UNI-PAK 21 TAB) 10 MG (21) TBPK tablet 6 day taper - take by mouth as directed for 6 days  . topiramate (TOPAMAX) 25 MG tablet Take 1 tablet (25 mg total) by mouth 2 (two) times daily.  . [DISCONTINUED] phentermine (ADIPEX-P) 37.5 MG tablet Take 1 tablet (37.5 mg total) by mouth daily before breakfast.   No facility-administered encounter medications on file as of 01/13/2020.   Medical History: Past Medical  History:  Diagnosis Date  . Sarcoidosis   . Thyroid nodule    Vital Signs: BP 140/90   Pulse 76   SpO2 99%    Review of Systems  Constitutional: Positive for unexpected weight change. Negative for chills, diaphoresis and fatigue.  HENT: Negative for sinus pressure.   Eyes: Positive for redness.  Respiratory: Negative for cough, shortness of breath and wheezing.   Gastrointestinal: Negative for vomiting.  Skin: Negative for color change.  Allergic/Immunologic: Negative for environmental allergies and food allergies.  Neurological: Negative for dizziness and headaches.  Hematological: Does not bruise/bleed easily.  Psychiatric/Behavioral: Negative for behavioral problems (depression) and hallucinations.    Physical Exam Constitutional:      Appearance: Normal appearance.  Eyes:     Extraocular Movements: Extraocular movements intact.     Pupils: Pupils are equal, round, and reactive to light.  Cardiovascular:     Rate and Rhythm: Normal rate and regular rhythm.     Pulses: Normal pulses.     Heart sounds: Normal heart sounds.  Pulmonary:     Effort: Pulmonary effort is normal.     Breath sounds: Normal breath sounds.  Neurological:     Mental Status: She is alert.    Assessment/Plan: 1. Mild obesity Pt will start low dose phentermine, monitor bo at home, add topamax as well - topiramate (TOPAMAX) 25 MG tablet; Take 1 tablet (25 mg total) by mouth 2 (two) times daily.  Dispense: 60 tablet; Refill: 2 - phentermine 15 MG capsule; Take one tab po bid  Dispense: 60 capsule; Refill: 0  2. Elevated blood pressure reading Continue hctz as before.   General Counseling: Joy verbalizes understanding of the findings of todays visit and agrees with plan of treatment. I have discussed any further diagnostic evaluation that may be needed or ordered today. We also reviewed her medications today. she has been encouraged to call the office with any questions or concerns that should  arise related to todays visit. Obesity Counseling: Risk Assessment: An assessment of behavioral risk factors was made today and includes lack of exercise sedentary lifestyle, lack of portion control and poor dietary habits.  Risk Modification Advice: She was counseled on portion control guidelines. Restricting daily caloric intake to. . The detrimental long term effects of obesity on her health and ongoing poor compliance was also discussed with the patient.  Dermal filler. 2 syringes of Juvederm ulta plus xc is used at NL fold, chin and glabellar area   Meds ordered this encounter  Medications  . topiramate (TOPAMAX) 25 MG tablet    Sig: Take 1 tablet (25 mg total) by mouth 2 (two) times daily.    Dispense:  60 tablet    Refill:  2  . phentermine 15 MG capsule    Sig: Take one tab po bid    Dispense:  60 capsule    Refill:  0    Time spent:35 Minutes

## 2020-01-17 ENCOUNTER — Encounter: Payer: Self-pay | Admitting: Internal Medicine

## 2020-01-17 ENCOUNTER — Other Ambulatory Visit: Payer: Self-pay

## 2020-01-17 ENCOUNTER — Ambulatory Visit: Payer: BC Managed Care – PPO | Admitting: Internal Medicine

## 2020-02-18 ENCOUNTER — Other Ambulatory Visit: Payer: Self-pay

## 2020-02-18 MED ORDER — FLUCONAZOLE 150 MG PO TABS: ORAL_TABLET | ORAL | 0 refills | Status: DC

## 2020-02-19 DIAGNOSIS — Z6830 Body mass index (BMI) 30.0-30.9, adult: Secondary | ICD-10-CM | POA: Diagnosis not present

## 2020-02-19 DIAGNOSIS — B373 Candidiasis of vulva and vagina: Secondary | ICD-10-CM | POA: Diagnosis not present

## 2020-02-19 DIAGNOSIS — D869 Sarcoidosis, unspecified: Secondary | ICD-10-CM | POA: Diagnosis not present

## 2020-02-24 ENCOUNTER — Telehealth: Payer: Self-pay

## 2020-02-24 NOTE — Telephone Encounter (Signed)
Confirmed and screened for 02-28-20 ov. 

## 2020-02-28 ENCOUNTER — Encounter: Payer: Self-pay | Admitting: Internal Medicine

## 2020-02-28 ENCOUNTER — Ambulatory Visit: Payer: BC Managed Care – PPO | Admitting: Internal Medicine

## 2020-02-28 ENCOUNTER — Other Ambulatory Visit: Payer: Self-pay

## 2020-02-28 DIAGNOSIS — B373 Candidiasis of vulva and vagina: Secondary | ICD-10-CM | POA: Diagnosis not present

## 2020-02-28 DIAGNOSIS — B3731 Acute candidiasis of vulva and vagina: Secondary | ICD-10-CM

## 2020-02-28 DIAGNOSIS — D869 Sarcoidosis, unspecified: Secondary | ICD-10-CM

## 2020-02-28 DIAGNOSIS — Z6830 Body mass index (BMI) 30.0-30.9, adult: Secondary | ICD-10-CM | POA: Diagnosis not present

## 2020-02-28 MED ORDER — PHENTERMINE HCL 15 MG PO CAPS: ORAL_CAPSULE | ORAL | 0 refills | Status: DC

## 2020-02-28 MED ORDER — FLUCONAZOLE 150 MG PO TABS: ORAL_TABLET | ORAL | 3 refills | Status: DC

## 2020-02-28 NOTE — Progress Notes (Signed)
[facility] [place], [place] [address]  Internal MEDICINE  Office Visit Note  Patient Name: Susan Griffith  [id number]  [id number]  Date of Service: 03/12/2020  Chief Complaint  Patient presents with  . Follow-up    Weight loss    HPI Pt is here for follow up, feels well, her energy level is better, breathing is improved. She still feels itching in her vagina and discharge, she has been treated for yeast infection in the past  She is down to 204 bs today   Current Medication: Outpatient Encounter Medications as of 02/28/2020  Medication Sig  . albuterol (VENTOLIN HFA) 108 (90 Base) MCG/ACT inhaler Inhale 2 puffs into the lungs every 6 (six) hours as needed for wheezing or shortness of breath.  Marland Kitchen azithromycin (ZITHROMAX) 250 MG tablet z-pack - take as directed for 5 days for acute upper respiratory infection  . benzonatate (TESSALON) 200 MG capsule Take 1 capsule (200 mg total) by mouth 2 (two) times daily as needed for cough.  . chlorpheniramine-HYDROcodone (TUSSIONEX PENNKINETIC ER) 10-8 MG/5ML SUER Take 5 mLs by mouth every 12 (twelve) hours as needed for cough.  . famciclovir (FAMVIR) 500 MG tablet Take 1 tablet (500 mg total) by mouth 2 (two) times daily.  . hydrochlorothiazide (HYDRODIURIL) 12.5 MG tablet Take 1 tablet (12.5 mg total) by mouth daily.  Marland Kitchen ivermectin (STROMECTOL) 3 MG TABS tablet Take 1 tab po for 3 days  . phentermine 15 MG capsule Take one tab po bid  . predniSONE (STERAPRED UNI-PAK 21 TAB) 10 MG (21) TBPK tablet 6 day taper - take by mouth as directed for 6 days  . topiramate (TOPAMAX) 25 MG tablet Take 1 tablet (25 mg total) by mouth 2 (two) times daily.  . [DISCONTINUED] fluconazole (DIFLUCAN) 150 MG tablet Take 1 tab po qd for 3 days  . [DISCONTINUED] phentermine 15 MG capsule Take one tab po bid  . fluconazole (DIFLUCAN) 150 MG tablet Take one tab once a week for 4 weeks   No facility-administered encounter medications on file  as of 02/28/2020.    Surgical History: Past Surgical History:  Procedure Laterality Date  . child birth     natural  . thigh surgery  10/22/2018    Medical History: Past Medical History:  Diagnosis Date  . Sarcoidosis   . Thyroid nodule     Family History: Family History  Problem Relation Age of Onset  . Hyperlipidemia Mother   . Hypertension Mother     Social History   Socioeconomic History  . Marital status: Married    Spouse name: Not on file  . Number of children: Not on file  . Years of education: Not on file  . Highest education level: Not on file  Occupational History  . Not on file  Tobacco Use  . Smoking status: Never Smoker  . Smokeless tobacco: Never Used  Substance and Sexual Activity  . Alcohol use: Yes    Comment: ocassionally  . Drug use: Never  . Sexual activity: Not on file  Other Topics Concern  . Not on file  Social History Narrative  . Not on file   Social Determinants of Health   Financial Resource Strain:   . Difficulty of Paying Living Expenses:   Food Insecurity:   . Worried About Charity fundraiser in the Last Year:   . [place] in the Last Year:   Transportation Needs:   . Lack of  Transportation (Medical):   Marland Kitchen Lack of Transportation (Non-Medical):   Physical Activity:   . Days of Exercise per Week:   . Minutes of Exercise per Session:   Stress:   . Feeling of Stress :   Social Connections:   . Frequency of Communication with Friends and Family:   . Frequency of Social Gatherings with Friends and Family:   . Attends Religious Services:   . Active Member of Clubs or Organizations:   . Attends Archivist Meetings:   Marland Kitchen Marital Status:   Intimate Partner Violence:   . Fear of Current or Ex-Partner:   . Emotionally Abused:   Marland Kitchen Physically Abused:   . Sexually Abused:       Review of Systems  Constitutional: Negative for chills, diaphoresis and fatigue.  HENT: Negative for ear pain, postnasal drip and  sinus pressure.   Eyes: Negative for photophobia, discharge, redness, itching and visual disturbance.  Respiratory: Negative for cough, shortness of breath and wheezing.   Cardiovascular: Negative for chest pain, palpitations and leg swelling.  Gastrointestinal: Negative for abdominal pain, constipation, diarrhea, nausea and vomiting.  Genitourinary: Negative for dysuria and flank pain.       Vaginal itching and discharge   Musculoskeletal: Negative for arthralgias, back pain, gait problem and neck pain.  Skin: Negative for color change.  Allergic/Immunologic: Negative for environmental allergies and food allergies.  Neurological: Negative for dizziness and headaches.  Hematological: Does not bruise/bleed easily.  Psychiatric/Behavioral: Negative for agitation, behavioral problems (depression) and hallucinations.    Vital Signs: BP 122/84   Pulse 87   Temp 98 F (36.7 C)   Resp 16   Ht 5\' 9"  (1.753 m)   Wt 204 lb (92.5 kg)   SpO2 97%   BMI 30.13 kg/m    Physical Exam Constitutional:      Appearance: Normal appearance.  HENT:     Head: Normocephalic and atraumatic.  Eyes:     Extraocular Movements: Extraocular movements intact.  Cardiovascular:     Rate and Rhythm: Normal rate and regular rhythm.     Heart sounds: Normal heart sounds. No murmur heard.   Pulmonary:     Effort: Pulmonary effort is normal.     Breath sounds: Normal breath sounds. No wheezing.  Abdominal:     General: Abdomen is flat.  Neurological:     Mental Status: She is alert.    Assessment/Plan: 1. Yeast vaginitis - Suppressive therapy for few weeks to a month.  - Fluconazole (DIFLUCAN) 150 MG tablet; Take one tab once a week for 4 weeks  Dispense: 4 tablet; Refill: 3  2. BMI 30.0-30.9,adult - Pt is responding well to low dose topamax and low dose phentermine, no side effects are noted  - Phentermine 15 MG capsule; Take one tab po bid  Dispense: 60 capsule; Refill: 0  3. Sarcoidosis - Stable  with no flare up, Continue albuterol prn   General Counseling: Susan Griffith verbalizes understanding of the findings of todays visit and agrees with plan of treatment. I have discussed any further diagnostic evaluation that may be needed or ordered today. We also reviewed her medications today. she has been encouraged to call the office with any questions or concerns that should arise related to todays visit.  Meds ordered this encounter  Medications  . phentermine 15 MG capsule    Sig: Take one tab po bid    Dispense:  60 capsule    Refill:  0  . fluconazole (DIFLUCAN)  150 MG tablet    Sig: Take one tab once a week for 4 weeks    Dispense:  4 tablet    Refill:  3    Total time spent:35 Minutes Time spent includes review of chart, medications, test results, and follow up plan with the patient.   Dr Lavera Guise Internal medicine

## 2020-04-06 ENCOUNTER — Telehealth: Payer: Self-pay

## 2020-04-06 NOTE — Telephone Encounter (Signed)
Confirmed and screened for 04-10-20 ov. °

## 2020-04-10 ENCOUNTER — Telehealth: Payer: Self-pay

## 2020-04-10 ENCOUNTER — Other Ambulatory Visit: Payer: Self-pay

## 2020-04-10 ENCOUNTER — Other Ambulatory Visit: Payer: Self-pay | Admitting: Internal Medicine

## 2020-04-10 ENCOUNTER — Encounter: Payer: Self-pay | Admitting: Internal Medicine

## 2020-04-10 ENCOUNTER — Ambulatory Visit: Payer: BC Managed Care – PPO | Admitting: Hospice and Palliative Medicine

## 2020-04-10 DIAGNOSIS — D869 Sarcoidosis, unspecified: Secondary | ICD-10-CM | POA: Diagnosis not present

## 2020-04-10 DIAGNOSIS — Z6830 Body mass index (BMI) 30.0-30.9, adult: Secondary | ICD-10-CM | POA: Diagnosis not present

## 2020-04-10 DIAGNOSIS — Z1231 Encounter for screening mammogram for malignant neoplasm of breast: Secondary | ICD-10-CM

## 2020-04-10 DIAGNOSIS — J4521 Mild intermittent asthma with (acute) exacerbation: Secondary | ICD-10-CM

## 2020-04-10 DIAGNOSIS — Z1239 Encounter for other screening for malignant neoplasm of breast: Secondary | ICD-10-CM | POA: Diagnosis not present

## 2020-04-10 MED ORDER — PHENTERMINE HCL 15 MG PO CAPS: ORAL_CAPSULE | ORAL | 0 refills | Status: DC

## 2020-04-10 MED ORDER — ALBUTEROL SULFATE HFA 108 (90 BASE) MCG/ACT IN AERS: 2.0000 | INHALATION_SPRAY | Freq: Four times a day (QID) | RESPIRATORY_TRACT | 2 refills | Status: DC | PRN

## 2020-04-10 NOTE — Telephone Encounter (Signed)
Nimisha has faxed over pt's Cologuard paperwork on 04/10/20.

## 2020-04-10 NOTE — Progress Notes (Addendum)
[facility] [place], [place] [address]  Internal MEDICINE  Office Visit Note  Patient Name: Susan Griffith  [id number]  [id number]  Date of Service: 04/10/2020  Chief Complaint  Patient presents with  . Medical Management of Chronic Issues    weight management  . Hypothyroidism  . Quality Metric Gaps    PAP,cologuard    HPI Patient is here for routine follow-up for weight loss. She was on vacation over the weekend and did gain a couple of pounds. She plans to get back on her regimen with healthy diet and exercise. Has been taking Phentermine and Topamax for weight loss. Has had a 2 pound weight gain since last visit in [name], had lost of few pounds before going on vacation. Relates her weight gain to her recent vacation and not following her healthy diet and exercise plan. BP well controlled today, continues to be compliant with current regimen of HCTZ daily. Denies chest pain, shortness of breath or heart palpitations. Discussed with her options for recommended colon cancer screening options. Discussed the importance of routine screening through colonoscopy as well as Cologuard. At this time she would like to proceed with Cologuard testing.   Current Medication: Outpatient Encounter Medications as of 04/10/2020  Medication Sig  . albuterol (VENTOLIN HFA) 108 (90 Base) MCG/ACT inhaler Inhale 2 puffs into the lungs every 6 (six) hours as needed for wheezing or shortness of breath.  . famciclovir (FAMVIR) 500 MG tablet Take 1 tablet (500 mg total) by mouth 2 (two) times daily.  . fluconazole (DIFLUCAN) 150 MG tablet Take one tab once a week for 4 weeks  . hydrochlorothiazide (HYDRODIURIL) 12.5 MG tablet Take 1 tablet (12.5 mg total) by mouth daily.  . phentermine 15 MG capsule Take one tab po bid  . topiramate (TOPAMAX) 25 MG tablet Take 1 tablet (25 mg total) by mouth 2 (two) times daily.  . [DISCONTINUED] albuterol (VENTOLIN HFA) 108 (90 Base) MCG/ACT inhaler  Inhale 2 puffs into the lungs every 6 (six) hours as needed for wheezing or shortness of breath.  . [DISCONTINUED] phentermine 15 MG capsule Take one tab po bid   No facility-administered encounter medications on file as of 04/10/2020.    Surgical History: Past Surgical History:  Procedure Laterality Date  . child birth     natural  . thigh surgery  10/22/2018    Medical History: Past Medical History:  Diagnosis Date  . Sarcoidosis   . Thyroid nodule     Family History: Family History  Problem Relation Age of Onset  . Hyperlipidemia Mother   . Hypertension Mother     Social History   Socioeconomic History  . Marital status: Married    Spouse name: Not on file  . Number of children: Not on file  . Years of education: Not on file  . Highest education level: Not on file  Occupational History  . Not on file  Tobacco Use  . Smoking status: Never Smoker  . Smokeless tobacco: Never Used  Substance and Sexual Activity  . Alcohol use: Yes    Comment: ocassionally  . Drug use: Never  . Sexual activity: Not on file  Other Topics Concern  . Not on file  Social History Narrative  . Not on file   Social Determinants of Health   Financial Resource Strain:   . Difficulty of Paying Living Expenses:   Food Insecurity:   . Worried About Charity fundraiser in the Last  Year:   . Ran Out of Food in the Last Year:   Transportation Needs:   . Film/video editor (Medical):   Marland Kitchen Lack of Transportation (Non-Medical):   Physical Activity:   . Days of Exercise per Week:   . Minutes of Exercise per Session:   Stress:   . Feeling of Stress :   Social Connections:   . Frequency of Communication with Friends and Family:   . Frequency of Social Gatherings with Friends and Family:   . Attends Religious Services:   . Active Member of Clubs or Organizations:   . Attends Archivist Meetings:   Marland Kitchen Marital Status:   Intimate Partner Violence:   . Fear of Current or  Ex-Partner:   . Emotionally Abused:   Marland Kitchen Physically Abused:   . Sexually Abused:     Review of Systems  Constitutional: Negative.        Negative for chills, fever.  HENT: Negative.        For sinus pain, sinus pressure, sore throat, trouble swallowing.  Eyes: Negative.        For changes in vision or visual disturbances.  Respiratory: Negative.        For chest tightness, cough, shortness of breath, wheezing.  Cardiovascular: Negative.        For chest pain, ankle swelling, palpitations.  Gastrointestinal:       For abdominal pain, constipation, nausea, vomiting, diarrhea.  Endocrine: Negative.        For polydipsia, polyphagia, polyuria.  Genitourinary: Negative.        For dysuria, flank pain, hematuria, increased frequency, urgency.  Musculoskeletal: Negative.        For arthralgias, myalgias, back pain, neck pain, gait disturbances.  Skin:       For rash, wound.  Allergic/Immunologic: Negative.   Neurological: Negative.        For dizziness, headaches, tremors, weakness.  Hematological: Negative.   Psychiatric/Behavioral: Negative.        Confusion, depression, anxiety, sleep disturbances.    Vital Signs: BP 128/84   Pulse 75   Resp 16   Ht 5\' 9"  (1.753 m)   Wt (!) 206 lb (93.4 kg)   SpO2 99%   BMI 30.42 kg/m    Physical Exam Vitals reviewed.  Constitutional:      Appearance: Normal appearance.  Cardiovascular:     Rate and Rhythm: Normal rate and regular rhythm.     Pulses: Normal pulses.     Heart sounds: Normal heart sounds.  Pulmonary:     Effort: Pulmonary effort is normal.     Breath sounds: Normal breath sounds.  Abdominal:     General: Abdomen is flat. Bowel sounds are normal.     Palpations: Abdomen is soft.  Musculoskeletal:        General: Normal range of motion.     Cervical back: Normal range of motion.  Skin:    General: Skin is warm.  Neurological:     Mental Status: She is alert and oriented to person, place, and time. Mental  status is at baseline.  Psychiatric:        Mood and Affect: Mood normal.        Behavior: Behavior normal.        Thought Content: Thought content normal.        Judgment: Judgment normal.    Assessment/Plan: 1. Sarcoidosis Breathing well controlled at this time, continue with albuterol inhaler as needed and continue to  follow-up with pulmonology. - albuterol (VENTOLIN HFA) 108 (90 Base) MCG/ACT inhaler; Inhale 2 puffs into the lungs every 6 (six) hours as needed for wheezing or shortness of breath.  Dispense: 18 g; Refill: 2  2. BMI 30.0-30.9,adult Has had 2 pound weight gain since last visit, contributes this to recent vacation. Will begin following diet and exercise regimen to assist with weight gain. Will continue to follow progress with assistance of phentermine. No side effects noted from low dose Topamax or phentermine. Obesity Counseling: Risk Assessment: An assessment of behavioral risk factors was made today and includes lack of exercise sedentary lifestyle, lack of portion control and poor dietary habits.  Risk Modification Advice: She was counseled on portion control guidelines. Restricting daily caloric intake to 1800. The detrimental long term effects of obesity on her health and ongoing poor compliance was also discussed with the patient. - phentermine 15 MG capsule; Take one tab po bid  Dispense: 60 capsule; Refill: 0  3. Encounter for screening breast examination - MM DIGITAL SCREENING BILATERAL; Future  General Counseling: Lazara verbalizes understanding of the findings of todays visit and agrees with plan of treatment. I have discussed any further diagnostic evaluation that may be needed or ordered today. We also reviewed her medications today. she has been encouraged to call the office with any questions or concerns that should arise related to todays visit.    Orders Placed This Encounter  Procedures  . MM DIGITAL SCREENING BILATERAL    Meds ordered this encounter   Medications  . albuterol (VENTOLIN HFA) 108 (90 Base) MCG/ACT inhaler    Sig: Inhale 2 puffs into the lungs every 6 (six) hours as needed for wheezing or shortness of breath.    Dispense:  18 g    Refill:  2  . phentermine 15 MG capsule    Sig: Take one tab po bid    Dispense:  60 capsule    Refill:  0    Total time spent: 20  Minutes  This patient was seen by Theodoro Grist AGNP-C in collaboration with Dr. Lavera Guise as a part of a collaboration care agreement.  Time spent includes review of chart, medications, test results, and follow up plan with the patient.    Tanna Furry Kourtney Terriquez AGNP-C Internal medicine

## 2020-04-10 NOTE — Telephone Encounter (Signed)
done

## 2020-05-04 DIAGNOSIS — Z1211 Encounter for screening for malignant neoplasm of colon: Secondary | ICD-10-CM | POA: Diagnosis not present

## 2020-05-04 DIAGNOSIS — Z1212 Encounter for screening for malignant neoplasm of rectum: Secondary | ICD-10-CM | POA: Diagnosis not present

## 2020-05-05 ENCOUNTER — Ambulatory Visit
Admission: RE | Admit: 2020-05-05 | Discharge: 2020-05-05 | Disposition: A | Discharge status: Home / Self Care | Payer: BLUE CROSS/BLUE SHIELD | Source: Ambulatory Visit | Attending: Internal Medicine | Admitting: Internal Medicine

## 2020-05-05 ENCOUNTER — Ambulatory Visit: Payer: BLUE CROSS/BLUE SHIELD

## 2020-05-05 ENCOUNTER — Other Ambulatory Visit: Payer: Self-pay

## 2020-05-05 DIAGNOSIS — Z1231 Encounter for screening mammogram for malignant neoplasm of breast: Secondary | ICD-10-CM | POA: Diagnosis not present

## 2020-05-05 DIAGNOSIS — Z1239 Encounter for other screening for malignant neoplasm of breast: Secondary | ICD-10-CM

## 2020-05-11 LAB — EXTERNAL GENERIC LAB PROCEDURE: COLOGUARD: NEGATIVE

## 2020-05-11 LAB — COLOGUARD: COLOGUARD: NEGATIVE

## 2020-05-22 ENCOUNTER — Ambulatory Visit: Payer: BC Managed Care – PPO | Admitting: Internal Medicine

## 2020-05-22 ENCOUNTER — Encounter: Payer: Self-pay | Admitting: Internal Medicine

## 2020-05-22 ENCOUNTER — Other Ambulatory Visit: Payer: Self-pay

## 2020-05-22 VITALS — BP 122/84 | HR 75 | Temp 97.7°F | Resp 16 | Ht 69.0 in | Wt 204.0 lb

## 2020-05-22 DIAGNOSIS — E2839 Other primary ovarian failure: Secondary | ICD-10-CM

## 2020-05-22 DIAGNOSIS — Z6830 Body mass index (BMI) 30.0-30.9, adult: Secondary | ICD-10-CM

## 2020-05-22 DIAGNOSIS — R300 Dysuria: Secondary | ICD-10-CM | POA: Diagnosis not present

## 2020-05-22 DIAGNOSIS — Z124 Encounter for screening for malignant neoplasm of cervix: Secondary | ICD-10-CM

## 2020-05-22 DIAGNOSIS — D869 Sarcoidosis, unspecified: Secondary | ICD-10-CM

## 2020-05-22 DIAGNOSIS — Z0001 Encounter for general adult medical examination with abnormal findings: Secondary | ICD-10-CM

## 2020-05-22 MED ORDER — PHENTERMINE HCL 15 MG PO CAPS: ORAL_CAPSULE | ORAL | 0 refills | Status: DC

## 2020-05-22 MED ORDER — TOPIRAMATE 25 MG PO TABS: 25.0000 mg | ORAL_TABLET | Freq: Two times a day (BID) | ORAL | 2 refills | Status: DC

## 2020-05-22 NOTE — Progress Notes (Signed)
[facility] [place],  [address]  Internal MEDICINE  Office Visit Note  Patient Name: Susan Griffith  [id number]  [id number]  Date of Service: 05/24/2020  Chief Complaint  Patient presents with   Annual Exam   Medical Management of Chronic Issues    wEIGHT MANAGMENT    Hypothyroidism   Migraine   Sarcoidosis    HPI Pt is here for routine health maintenance examination. Denies any complaints except has been having more hot flashes especially at night. She has declined HRT ( breast cancer second maternal cousin). She has made life style change but still struggles with some food choices. Sarcoid is under good control. Her Cologuard results are pending    Current Medication: Outpatient Encounter Medications as of 05/22/2020  Medication Sig   albuterol (VENTOLIN HFA) 108 (90 Base) MCG/ACT inhaler Inhale 2 puffs into the lungs every 6 (six) hours as needed for wheezing or shortness of breath.   famciclovir (FAMVIR) 500 MG tablet Take 1 tablet (500 mg total) by mouth 2 (two) times daily.   fluconazole (DIFLUCAN) 150 MG tablet Take one tab once a week for 4 weeks   hydrochlorothiazide (HYDRODIURIL) 12.5 MG tablet Take 1 tablet (12.5 mg total) by mouth daily.   phentermine 15 MG capsule Take one tab po bid   topiramate (TOPAMAX) 25 MG tablet Take 1 tablet (25 mg total) by mouth 2 (two) times daily.   [DISCONTINUED] phentermine 15 MG capsule Take one tab po bid   [DISCONTINUED] topiramate (TOPAMAX) 25 MG tablet Take 1 tablet (25 mg total) by mouth 2 (two) times daily.   No facility-administered encounter medications on file as of 05/22/2020.    Surgical History: Past Surgical History:  Procedure Laterality Date   child birth     natural   thigh surgery  10/22/2018    Medical History: Past Medical History:  Diagnosis Date   Migraines    Sarcoidosis    Thyroid nodule     Family History: Family History  Problem  Relation Age of Onset   Hyperlipidemia Mother    Hypertension Mother     Review of Systems  Constitutional: Negative for activity change, appetite change, chills, diaphoresis, fatigue, fever and unexpected weight change.  HENT: Negative for congestion, ear discharge, ear pain, facial swelling, hearing loss, nosebleeds, postnasal drip, rhinorrhea, sinus pressure, sinus pain, sneezing, sore throat, tinnitus, trouble swallowing and voice change.   Eyes: Negative for photophobia, pain, discharge, redness, itching and visual disturbance.  Respiratory: Negative for apnea, cough, choking, chest tightness, shortness of breath, wheezing and stridor.   Cardiovascular: Negative for chest pain, palpitations and leg swelling.  Gastrointestinal: Negative for abdominal distention, abdominal pain, anal bleeding, constipation, diarrhea, nausea and rectal pain.  Endocrine: Negative for cold intolerance, heat intolerance, polydipsia, polyphagia and polyuria.  Genitourinary: Negative for difficulty urinating, flank pain, frequency, genital sores, hematuria, menstrual problem, pelvic pain, urgency, vaginal bleeding, vaginal discharge and vaginal pain.  Musculoskeletal: Negative for arthralgias, back pain, gait problem, joint swelling, myalgias and neck pain.  Skin: Negative for color change, pallor, rash and wound.  Allergic/Immunologic: Negative for environmental allergies, food allergies and immunocompromised state.  Neurological: Negative for dizziness, seizures, syncope, facial asymmetry, speech difficulty, weakness, light-headedness, numbness and headaches.  Hematological: Negative for adenopathy. Does not bruise/bleed easily.  Psychiatric/Behavioral: Negative for agitation, behavioral problems, confusion, decreased concentration, dysphoric mood, hallucinations, self-injury, sleep disturbance and suicidal ideas. The patient is not nervous/anxious and is not hyperactive.  Vital Signs: BP 122/84    Pulse  75    Temp 97.7 F (36.5 C)    Resp 16    Ht 5\' 9"  (1.753 m)    Wt 204 lb (92.5 kg)    SpO2 98%    BMI 30.13 kg/m    Physical Exam Constitutional:      General: She is not in acute distress.    Appearance: She is well-developed. She is not diaphoretic.  HENT:     Head: Normocephalic and atraumatic.     Mouth/Throat:     Pharynx: No oropharyngeal exudate.  Eyes:     Pupils: Pupils are equal, round, and reactive to light.  Neck:     Thyroid: No thyromegaly.     Vascular: No JVD.     Trachea: No tracheal deviation.  Cardiovascular:     Rate and Rhythm: Normal rate and regular rhythm.     Heart sounds: Normal heart sounds. No murmur heard.  No friction rub. No gallop.   Pulmonary:     Effort: Pulmonary effort is normal. No respiratory distress.     Breath sounds: No wheezing or rales.  Chest:     Chest wall: No tenderness.     Breasts:        Right: Normal.        Left: Normal.     Comments: Surgical scars noticed under bilateral breast ( breast reduction)  Abdominal:     General: Bowel sounds are normal.     Palpations: Abdomen is soft.  Genitourinary:    General: Normal vulva.     Comments: About 0.5 cm polyp noticed her cervical opening ( smooth and round)  Musculoskeletal:        General: Normal range of motion.     Cervical back: Normal range of motion and neck supple.  Lymphadenopathy:     Cervical: No cervical adenopathy.  Skin:    General: Skin is warm and dry.  Neurological:     Mental Status: She is alert and oriented to person, place, and time.     Cranial Nerves: No cranial nerve deficit.  Psychiatric:        Behavior: Behavior normal.        Thought Content: Thought content normal.        Judgment: Judgment normal.    LABS: Recent Results (from the past 2160 hour(s))  IGP, Aptima HPV     Status: None   Collection Time: 05/22/20 10:30 AM  Result Value Ref Range   Interpretation NILM     Comment: NEGATIVE FOR INTRAEPITHELIAL LESION OR MALIGNANCY.    Category NIL     Comment: Negative for Intraepithelial Lesion   Adequacy ENDO     Comment: Satisfactory for evaluation. Endocervical and/or squamous metaplastic cells (endocervical component) are present.    Clinician Provided ICD10 Comment     Comment: Z12.4   Performed by: Comment     Comment: Pat Patrick, Cytotechnologist (ASCP)   Note: Comment     Comment: The Pap smear is a screening test designed to aid in the detection of premalignant and malignant conditions of the uterine cervix.  It is not a diagnostic procedure and should not be used as the sole means of detecting cervical cancer.  Both false-positive and false-negative reports do occur.    Test Methodology Comment     Comment: This liquid based ThinPrep(R) pap test was screened with the use of an image guided system.    HPV Aptima  Negative Negative    Comment: This nucleic acid amplification test detects fourteen high-risk HPV types (16,18,31,33,35,39,45,51,52,56,58,59,66,68) without differentiation.   UA/M w/rflx Culture, Routine     Status: Abnormal   Collection Time: 05/22/20 10:30 AM   Specimen: Urine   Urine  Result Value Ref Range   Specific Gravity, UA 1.024 1.005 - 1.030   pH, UA 7.5 5.0 - 7.5   Color, UA Yellow Yellow   Appearance Ur Turbid (A) Clear   Leukocytes,UA Negative Negative   Protein,UA Negative Negative/Trace   Glucose, UA Negative Negative   Ketones, UA Trace (A) Negative   RBC, UA Negative Negative   Bilirubin, UA Negative Negative   Urobilinogen, Ur 0.2 0.2 - 1.0 mg/dL   Nitrite, UA Negative Negative   Microscopic Examination Comment     Comment: Microscopic follows if indicated.   Microscopic Examination See below:     Comment: Microscopic was indicated and was performed.   Urinalysis Reflex Comment     Comment: This specimen will not reflex to a Urine Culture.  Microscopic Examination     Status: None   Collection Time: 05/22/20 10:30 AM   Urine  Result Value Ref Range    WBC, UA None seen 0 - 5 /hpf   RBC 0-2 0 - 2 /hpf   Epithelial Cells (non renal) None seen 0 - 10 /hpf   Casts None seen None seen /lpf   Bacteria, UA None seen None seen/Few     Assessment/Plan: 1. Encounter for general adult medical examination with abnormal findings All labs, mammogram and Cologuard reviewed with pt - CBC with Differential/Platelet; Future - Lipid Panel With LDL/HDL Ratio; Future - TSH; Future - T4, free; Future - Comprehensive metabolic panel  2. Routine cervical smear Polyp noticed, presentation was benign , will await results  - IGP, Aptima HPV - UA/M w/rflx Culture, Routine  3. BMI 30.0-30.9,adult Continue life style modifications  - phentermine 15 MG capsule; Take one tab po bid  Dispense: 60 capsule; Refill: 0 - topiramate (TOPAMAX) 25 MG tablet; Take 1 tablet (25 mg total) by mouth 2 (two) times daily.  Dispense: 60 tablet; Refill: 2  4. Sarcoidosis Stable   5. Dysuria - Microscopic Examination  6. Ovarian failure Pt is having vasomotor symptoms, she was instructed to try Tylenol pm or Melatonin for restful sleep. Will try prescription medications if OTC therapy fails  - DG Bone Density; Future   General Counseling: Susan Griffith verbalizes understanding of the findings of todays visit and agrees with plan of treatment. I have discussed any further diagnostic evaluation that may be needed or ordered today. We also reviewed her medications today. she has been encouraged to call the office with any questions or concerns that should arise related to todays visit.  Orders Placed This Encounter  Procedures   Microscopic Examination   DG Bone Density   UA/M w/rflx Culture, Routine   CBC with Differential/Platelet   Lipid Panel With LDL/HDL Ratio   TSH   T4, free   Comprehensive metabolic panel    Meds ordered this encounter  Medications   phentermine 15 MG capsule    Sig: Take one tab po bid    Dispense:  60 capsule    Refill:  0    topiramate (TOPAMAX) 25 MG tablet    Sig: Take 1 tablet (25 mg total) by mouth 2 (two) times daily.    Dispense:  60 tablet    Refill:  2    Total time  spent:40 Minutes  Time spent includes review of chart, medications, test results, and follow up plan with the patient.     Lavera Guise, MD  Internal Medicine

## 2020-05-23 LAB — UA/M W/RFLX CULTURE, ROUTINE
Bilirubin, UA: NEGATIVE
Glucose, UA: NEGATIVE
Leukocytes,UA: NEGATIVE
Nitrite, UA: NEGATIVE
Protein,UA: NEGATIVE
RBC, UA: NEGATIVE
Specific Gravity, UA: 1.024 (ref 1.005–1.030)
Urobilinogen, Ur: 0.2 mg/dL (ref 0.2–1.0)
pH, UA: 7.5 (ref 5.0–7.5)

## 2020-05-23 LAB — MICROSCOPIC EXAMINATION
Bacteria, UA: NONE SEEN
Casts: NONE SEEN /lpf
Epithelial Cells (non renal): NONE SEEN /hpf (ref 0–10)
WBC, UA: NONE SEEN /hpf (ref 0–5)

## 2020-05-24 LAB — IGP, APTIMA HPV: HPV Aptima: NEGATIVE

## 2020-05-24 NOTE — Patient Instructions (Signed)
Osteopenia  Osteopenia is a loss of thickness (density) inside of the bones. Another name for osteopenia is low bone mass. Mild osteopenia is a normal part of aging. It is not a disease, and it does not cause symptoms. However, if you have osteopenia and continue to lose bone mass, you could develop a condition that causes the bones to become thin and break more easily (osteoporosis). You may also lose some height, have back pain, and have a stooped posture. Although osteopenia is not a disease, making changes to your lifestyle and diet can help to prevent osteopenia from developing into osteoporosis. What are the causes? Osteopenia is caused by loss of calcium in the bones.  Bones are constantly changing. Old bone cells are continually being replaced with new bone cells. This process builds new bone. The mineral calcium is needed to build new bone and maintain bone density. Bone density is usually highest around age 35. After that, most people's bodies cannot replace all the bone they have lost with new bone. What increases the risk? You are more likely to develop this condition if:  You are older than age 50.  You are a woman who went through menopause early.  You have a long illness that keeps you in bed.  You do not get enough exercise.  You lack certain nutrients (malnutrition).  You have an overactive thyroid gland (hyperthyroidism).  You smoke.  You drink a lot of alcohol.  You are taking medicines that weaken the bones, such as steroids. What are the signs or symptoms? This condition does not cause any symptoms. You may have a slightly higher risk for bone breaks (fractures), so getting fractures more easily than normal may be an indication of osteopenia. How is this diagnosed? Your health care provider can diagnose this condition with a special type of X-ray exam that measures bone density (dual-energy X-ray absorptiometry, DEXA). This test can measure bone density in your  hips, spine, and wrists. Osteopenia has no symptoms, so this condition is usually diagnosed after a routine bone density screening test is done for osteoporosis. This routine screening is usually done for:  Women who are age 65 or older.  Men who are age 70 or older. If you have risk factors for osteopenia, you may have the screening test at an earlier age. How is this treated? Making dietary and lifestyle changes can lower your risk for osteoporosis. If you have severe osteopenia that is close to becoming osteoporosis, your health care provider may prescribe medicines and dietary supplements such as calcium and vitamin D. These supplements help to rebuild bone density. Follow these instructions at home:   Take over-the-counter and prescription medicines only as told by your health care provider. These include vitamins and supplements.  Eat a diet that is high in calcium and vitamin D. ? Calcium is found in dairy products, beans, salmon, and leafy green vegetables like spinach and broccoli. ? Look for foods that have vitamin D and calcium added to them (fortified foods), such as orange juice, cereal, and bread.  Do 30 or more minutes of a weight-bearing exercise every day, such as walking, jogging, or playing a sport. These types of exercises strengthen the bones.  Take precautions at home to lower your risk of falling, such as: ? Keeping rooms well-lit and free of clutter, such as cords. ? Installing safety rails on stairs. ? Using rubber mats in the bathroom or other areas that are often wet or slippery.  Do not use   any products that contain nicotine or tobacco, such as cigarettes and e-cigarettes. If you need help quitting, ask your health care provider.  Avoid alcohol or limit alcohol intake to no more than 1 drink a day for nonpregnant women and 2 drinks a day for men. One drink equals 12 oz of beer, 5 oz of wine, or 1 oz of hard liquor.  Keep all follow-up visits as told by your  health care provider. This is important. Contact a health care provider if:  You have not had a bone density screening for osteoporosis and you are: ? A woman, age 65 or older. ? A man, age 70 or older.  You are a postmenopausal woman who has not had a bone density screening for osteoporosis.  You are older than age 50 and you want to know if you should have bone density screening for osteoporosis. Summary  Osteopenia is a loss of thickness (density) inside of the bones. Another name for osteopenia is low bone mass.  Osteopenia is not a disease, but it may increase your risk for a condition that causes the bones to become thin and break more easily (osteoporosis).  You may be at risk for osteopenia if you are older than age 50 or if you are a woman who went through early menopause.  Osteopenia does not cause any symptoms, but it can be diagnosed with a bone density screening test.  Dietary and lifestyle changes are the first treatment for osteopenia. These may lower your risk for osteoporosis. This information is not intended to replace advice given to you by your health care provider. Make sure you discuss any questions you have with your health care provider. Document Revised: 08/08/2017 Document Reviewed: 06/04/2017 Elsevier Patient Education  2020 [employer] Inc[employer]  

## 2020-07-04 ENCOUNTER — Encounter: Payer: Self-pay | Admitting: Hospice and Palliative Medicine

## 2020-07-04 ENCOUNTER — Other Ambulatory Visit: Payer: Self-pay

## 2020-07-04 ENCOUNTER — Ambulatory Visit (INDEPENDENT_AMBULATORY_CARE_PROVIDER_SITE_OTHER): Payer: BC Managed Care – PPO | Admitting: Hospice and Palliative Medicine

## 2020-07-04 DIAGNOSIS — Z6829 Body mass index (BMI) 29.0-29.9, adult: Secondary | ICD-10-CM

## 2020-07-04 DIAGNOSIS — Z6830 Body mass index (BMI) 30.0-30.9, adult: Secondary | ICD-10-CM

## 2020-07-04 DIAGNOSIS — I10 Essential (primary) hypertension: Secondary | ICD-10-CM | POA: Diagnosis not present

## 2020-07-04 MED ORDER — PHENTERMINE HCL 15 MG PO CAPS: ORAL_CAPSULE | ORAL | 0 refills | Status: DC

## 2020-07-04 NOTE — Progress Notes (Signed)
[facility] [place], [place] [address]  Internal MEDICINE  Office Visit Note  Patient Name: Susan Griffith  [id number]  [id number]  Date of Service: 07/05/2020  Chief Complaint  Patient presents with  . Migraine  . Medical Management of Chronic Issues    weight managment  . controlled substance policy    reviewed    HPI Patient is here for routine follow-up Followed and managed for weight loss therapy She has just finished with the furniture market and will be getting her habits back on track now that her work will be back to normal She is currently taking Topamax as well as phentermine to assist with weight loss, she continues to work on healthy eating as well as daily exercise Since her last visit she has lost 2 pounds  No reports of negative side effects from the medications, denies palpitations, headaches or dizziness  Current Medication: Outpatient Encounter Medications as of 07/04/2020  Medication Sig  . albuterol (VENTOLIN HFA) 108 (90 Base) MCG/ACT inhaler Inhale 2 puffs into the lungs every 6 (six) hours as needed for wheezing or shortness of breath.  . famciclovir (FAMVIR) 500 MG tablet Take 1 tablet (500 mg total) by mouth 2 (two) times daily.  . fluconazole (DIFLUCAN) 150 MG tablet Take one tab once a week for 4 weeks  . hydrochlorothiazide (HYDRODIURIL) 12.5 MG tablet Take 1 tablet (12.5 mg total) by mouth daily.  . phentermine 15 MG capsule Take one tab po bid  . topiramate (TOPAMAX) 25 MG tablet Take 1 tablet (25 mg total) by mouth 2 (two) times daily.  . [DISCONTINUED] phentermine 15 MG capsule Take one tab po bid   No facility-administered encounter medications on file as of 07/04/2020.    Surgical History: Past Surgical History:  Procedure Laterality Date  . child birth     natural  . thigh surgery  10/22/2018    Medical History: Past Medical History:  Diagnosis Date  . Migraines   . Sarcoidosis   . Thyroid nodule      Family History: Family History  Problem Relation Age of Onset  . Hyperlipidemia Mother   . Hypertension Mother     Social History   Socioeconomic History  . Marital status: Married    Spouse name: Not on file  . Number of children: Not on file  . Years of education: Not on file  . Highest education level: Not on file  Occupational History  . Not on file  Tobacco Use  . Smoking status: Never Smoker  . Smokeless tobacco: Never Used  Substance and Sexual Activity  . Alcohol use: Yes    Comment: ocassionally  . Drug use: Never  . Sexual activity: Not on file  Other Topics Concern  . Not on file  Social History Narrative  . Not on file   Social Determinants of Health   Financial Resource Strain:   . Difficulty of Paying Living Expenses: Not on file  Food Insecurity:   . Worried About Charity fundraiser in the Last Year: Not on file  . Ran Out of Food in the Last Year: Not on file  Transportation Needs:   . Lack of Transportation (Medical): Not on file  . Lack of Transportation (Non-Medical): Not on file  Physical Activity:   . Days of Exercise per Week: Not on file  . Minutes of Exercise per Session: Not on file  Stress:   . Feeling of Stress : Not on  file  Social Connections:   . Frequency of Communication with Friends and Family: Not on file  . Frequency of Social Gatherings with Friends and Family: Not on file  . Attends Religious Services: Not on file  . Active Member of Clubs or Organizations: Not on file  . Attends Archivist Meetings: Not on file  . Marital Status: Not on file  Intimate Partner Violence:   . Fear of Current or Ex-Partner: Not on file  . Emotionally Abused: Not on file  . Physically Abused: Not on file  . Sexually Abused: Not on file    Review of Systems  Constitutional: Negative for chills, diaphoresis and fatigue.  HENT: Negative for ear pain, postnasal drip and sinus pressure.   Eyes: Negative for photophobia,  discharge, redness, itching and visual disturbance.  Respiratory: Negative for cough, shortness of breath and wheezing.   Cardiovascular: Negative for chest pain, palpitations and leg swelling.  Gastrointestinal: Negative for abdominal pain, constipation, diarrhea, nausea and vomiting.  Genitourinary: Negative for dysuria and flank pain.  Musculoskeletal: Negative for arthralgias, back pain, gait problem and neck pain.  Skin: Negative for color change.  Allergic/Immunologic: Negative for environmental allergies and food allergies.  Neurological: Negative for dizziness and headaches.  Hematological: Does not bruise/bleed easily.  Psychiatric/Behavioral: Negative for agitation, behavioral problems (depression) and hallucinations.    Vital Signs: BP 120/70   Pulse 74   Temp 98 F (36.7 C)   Resp 16   Ht 5\' 9"  (1.753 m)   Wt 202 lb (91.6 kg)   SpO2 97%   BMI 29.83 kg/m    Physical Exam Constitutional:      Appearance: Normal appearance.  Cardiovascular:     Rate and Rhythm: Normal rate and regular rhythm.     Pulses: Normal pulses.     Heart sounds: Normal heart sounds.  Pulmonary:     Effort: Pulmonary effort is normal.     Breath sounds: Normal breath sounds.  Musculoskeletal:        General: Normal range of motion.  Skin:    General: Skin is warm.  Neurological:     General: No focal deficit present.     Mental Status: She is alert and oriented to person, place, and time. Mental status is at baseline.  Psychiatric:        Mood and Affect: Mood normal.        Behavior: Behavior normal.        Thought Content: Thought content normal.        Judgment: Judgment normal.    Assessment/Plan: 1. Essential hypertension BP and HR well controlled today with HCTZ, continue with routine monitoring  2. BMI 29.0-29.9,adult May continue with low dose phentermine as well as topamax to assist with weight loss Encouraged to continue with healthy eating habits and daily exercise as  tolerated - phentermine 15 MG capsule; Take one tab po bid  Dispense: 60 capsule; Refill: 0  General Counseling: Rejina verbalizes understanding of the findings of todays visit and agrees with plan of treatment. I have discussed any further diagnostic evaluation that may be needed or ordered today. We also reviewed her medications today. she has been encouraged to call the office with any questions or concerns that should arise related to todays visit.  Meds ordered this encounter  Medications  . phentermine 15 MG capsule    Sig: Take one tab po bid    Dispense:  60 capsule    Refill:  0  Time spent: 30 Minutes Time spent includes review of chart, medications, test results and follow-up plan with the patient.  This patient was seen by Theodoro Grist AGNP-C in Collaboration with Dr Lavera Guise as a part of collaborative care agreement     Tanna Furry. Jamela Cumbo AGNP-C Internal medicine

## 2020-07-05 ENCOUNTER — Encounter: Payer: Self-pay | Admitting: Hospice and Palliative Medicine

## 2020-08-02 ENCOUNTER — Ambulatory Visit: Payer: BC Managed Care – PPO | Admitting: Hospice and Palliative Medicine

## 2020-09-17 ENCOUNTER — Other Ambulatory Visit: Payer: Self-pay | Admitting: Internal Medicine

## 2020-09-17 DIAGNOSIS — Z6830 Body mass index (BMI) 30.0-30.9, adult: Secondary | ICD-10-CM

## 2020-09-18 NOTE — Telephone Encounter (Signed)
Pt needs follow up before refills

## 2020-10-13 DIAGNOSIS — M542 Cervicalgia: Secondary | ICD-10-CM | POA: Diagnosis not present

## 2020-10-13 DIAGNOSIS — M546 Pain in thoracic spine: Secondary | ICD-10-CM | POA: Diagnosis not present

## 2020-10-13 DIAGNOSIS — M9901 Segmental and somatic dysfunction of cervical region: Secondary | ICD-10-CM | POA: Diagnosis not present

## 2020-10-13 DIAGNOSIS — M9902 Segmental and somatic dysfunction of thoracic region: Secondary | ICD-10-CM | POA: Diagnosis not present

## 2020-10-18 DIAGNOSIS — M9901 Segmental and somatic dysfunction of cervical region: Secondary | ICD-10-CM | POA: Diagnosis not present

## 2020-10-18 DIAGNOSIS — M9902 Segmental and somatic dysfunction of thoracic region: Secondary | ICD-10-CM | POA: Diagnosis not present

## 2020-10-18 DIAGNOSIS — M546 Pain in thoracic spine: Secondary | ICD-10-CM | POA: Diagnosis not present

## 2020-10-18 DIAGNOSIS — M542 Cervicalgia: Secondary | ICD-10-CM | POA: Diagnosis not present

## 2020-10-23 DIAGNOSIS — M9902 Segmental and somatic dysfunction of thoracic region: Secondary | ICD-10-CM | POA: Diagnosis not present

## 2020-10-23 DIAGNOSIS — M9901 Segmental and somatic dysfunction of cervical region: Secondary | ICD-10-CM | POA: Diagnosis not present

## 2020-10-23 DIAGNOSIS — M546 Pain in thoracic spine: Secondary | ICD-10-CM | POA: Diagnosis not present

## 2020-10-23 DIAGNOSIS — M542 Cervicalgia: Secondary | ICD-10-CM | POA: Diagnosis not present

## 2020-10-24 ENCOUNTER — Telehealth: Payer: Self-pay

## 2020-10-24 NOTE — Telephone Encounter (Signed)
LMOM for pt to make appt to see DFK for Botox on Thursday 10-26-20 before 3

## 2020-10-26 ENCOUNTER — Ambulatory Visit: Payer: Self-pay | Admitting: Internal Medicine

## 2020-10-26 ENCOUNTER — Other Ambulatory Visit: Payer: Self-pay

## 2020-10-26 DIAGNOSIS — Z411 Encounter for cosmetic surgery: Secondary | ICD-10-CM

## 2020-10-26 NOTE — Progress Notes (Signed)
Pt is seen for cosmetic procedure  Consent is taken Botox is injected to Glabellar, frontalis and crow's feet area Total 30 units   Pt tolerated it well

## 2020-10-30 DIAGNOSIS — M9902 Segmental and somatic dysfunction of thoracic region: Secondary | ICD-10-CM | POA: Diagnosis not present

## 2020-10-30 DIAGNOSIS — M546 Pain in thoracic spine: Secondary | ICD-10-CM | POA: Diagnosis not present

## 2020-10-30 DIAGNOSIS — M542 Cervicalgia: Secondary | ICD-10-CM | POA: Diagnosis not present

## 2020-10-30 DIAGNOSIS — M9901 Segmental and somatic dysfunction of cervical region: Secondary | ICD-10-CM | POA: Diagnosis not present

## 2020-11-02 NOTE — Telephone Encounter (Signed)
See attach

## 2020-11-15 DIAGNOSIS — M9902 Segmental and somatic dysfunction of thoracic region: Secondary | ICD-10-CM | POA: Diagnosis not present

## 2020-11-15 DIAGNOSIS — M546 Pain in thoracic spine: Secondary | ICD-10-CM | POA: Diagnosis not present

## 2020-11-15 DIAGNOSIS — M542 Cervicalgia: Secondary | ICD-10-CM | POA: Diagnosis not present

## 2020-11-15 DIAGNOSIS — M9901 Segmental and somatic dysfunction of cervical region: Secondary | ICD-10-CM | POA: Diagnosis not present

## 2020-12-15 DIAGNOSIS — M542 Cervicalgia: Secondary | ICD-10-CM | POA: Diagnosis not present

## 2020-12-15 DIAGNOSIS — M62830 Muscle spasm of back: Secondary | ICD-10-CM | POA: Diagnosis not present

## 2020-12-15 DIAGNOSIS — M9901 Segmental and somatic dysfunction of cervical region: Secondary | ICD-10-CM | POA: Diagnosis not present

## 2020-12-15 DIAGNOSIS — M9903 Segmental and somatic dysfunction of lumbar region: Secondary | ICD-10-CM | POA: Diagnosis not present

## 2021-02-06 ENCOUNTER — Other Ambulatory Visit: Payer: Self-pay | Admitting: Nurse Practitioner

## 2021-02-06 ENCOUNTER — Ambulatory Visit: Payer: BC Managed Care – PPO | Admitting: Nurse Practitioner

## 2021-02-06 ENCOUNTER — Other Ambulatory Visit: Payer: Self-pay

## 2021-02-06 ENCOUNTER — Encounter: Payer: Self-pay | Admitting: Nurse Practitioner

## 2021-02-06 VITALS — BP 110/84 | HR 85 | Temp 98.0°F | Resp 16 | Ht 68.5 in | Wt 198.4 lb

## 2021-02-06 DIAGNOSIS — Z6830 Body mass index (BMI) 30.0-30.9, adult: Secondary | ICD-10-CM

## 2021-02-06 DIAGNOSIS — B373 Candidiasis of vulva and vagina: Secondary | ICD-10-CM

## 2021-02-06 DIAGNOSIS — Z6829 Body mass index (BMI) 29.0-29.9, adult: Secondary | ICD-10-CM | POA: Diagnosis not present

## 2021-02-06 DIAGNOSIS — I83813 Varicose veins of bilateral lower extremities with pain: Secondary | ICD-10-CM | POA: Insufficient documentation

## 2021-02-06 DIAGNOSIS — R030 Elevated blood-pressure reading, without diagnosis of hypertension: Secondary | ICD-10-CM | POA: Diagnosis not present

## 2021-02-06 DIAGNOSIS — D869 Sarcoidosis, unspecified: Secondary | ICD-10-CM

## 2021-02-06 DIAGNOSIS — B3731 Acute candidiasis of vulva and vagina: Secondary | ICD-10-CM

## 2021-02-06 MED ORDER — TOPIRAMATE 25 MG PO TABS: ORAL_TABLET | ORAL | 0 refills | Status: DC

## 2021-02-06 MED ORDER — FLUCONAZOLE 150 MG PO TABS: ORAL_TABLET | ORAL | 3 refills | Status: DC

## 2021-02-06 MED ORDER — PHENTERMINE HCL 15 MG PO CAPS: 15.0000 mg | ORAL_CAPSULE | Freq: Two times a day (BID) | ORAL | 0 refills | Status: DC

## 2021-02-06 MED ORDER — FAMCICLOVIR 500 MG PO TABS
500.0000 mg | ORAL_TABLET | Freq: Two times a day (BID) | ORAL | 0 refills | Status: DC
Start: 2021-02-06 — End: 2021-02-06

## 2021-02-06 MED ORDER — HYDROCHLOROTHIAZIDE 12.5 MG PO TABS: 12.5000 mg | ORAL_TABLET | Freq: Every day | ORAL | 3 refills | Status: DC

## 2021-02-06 NOTE — Progress Notes (Signed)
[facility] [place], [place] [address]  Internal MEDICINE  Office Visit Note  Patient Name: Susan Griffith  [id number]  [id number]  Date of Service: 02/06/2021  Chief Complaint  Patient presents with  . Follow-up    refils  . Quality Metric Gaps    Shingrix     HPI Susan Griffith presents for a follow-up visit for medication refills.  Susan Griffith presents for a follow-up visit for medication refills. She has a history of hypothyroidism, thyroid nodule, migraines, and sarcoidosis. Her most recent visit was for cosmetic botox injections. Her most recent routine labs were drawn in 2020. Her pap was done in September 2021.  - c/o painful swollen veins in bilateral lower legs. She has had varicose veins in the past and had vascular surgery treat them.  -was taking phentermine for weight loss, has not had it since February, but wants to get back on phentermine. Current weight is 198 lbs and BMI is 29.73. Her weight is down 4 lbs from October 2021 (202 lbs.).  History of recurrent vaginal yeast infections, diflucan prescription requested to have it on hand.    Current Medication: Outpatient Encounter Medications as of 02/06/2021  Medication Sig  . albuterol (VENTOLIN HFA) 108 (90 Base) MCG/ACT inhaler Inhale 2 puffs into the lungs every 6 (six) hours as needed for wheezing or shortness of breath.  . phentermine 15 MG capsule Take 1 capsule (15 mg total) by mouth 2 (two) times daily with breakfast and lunch.  . [DISCONTINUED] famciclovir (FAMVIR) 500 MG tablet Take 1 tablet (500 mg total) by mouth 2 (two) times daily.  . [DISCONTINUED] fluconazole (DIFLUCAN) 150 MG tablet Take one tab once a week for 4 weeks  . [DISCONTINUED] hydrochlorothiazide (HYDRODIURIL) 12.5 MG tablet Take 1 tablet (12.5 mg total) by mouth daily.  . [DISCONTINUED] phentermine 15 MG capsule Take one tab po bid  . [DISCONTINUED] topiramate (TOPAMAX) 25 MG tablet TAKE 1 TABLET(25 MG) BY MOUTH TWICE DAILY  .  fluconazole (DIFLUCAN) 150 MG tablet Take one tab once a week for 4 weeks  . hydrochlorothiazide (HYDRODIURIL) 12.5 MG tablet Take 1 tablet (12.5 mg total) by mouth daily.  Marland Kitchen topiramate (TOPAMAX) 25 MG tablet TAKE 1 TABLET(25 MG) BY MOUTH TWICE DAILY  . [DISCONTINUED] famciclovir (FAMVIR) 500 MG tablet Take 1 tablet (500 mg total) by mouth 2 (two) times daily.   No facility-administered encounter medications on file as of 02/06/2021.    Surgical History: Past Surgical History:  Procedure Laterality Date  . child birth     natural  . thigh surgery  10/22/2018    Medical History: Past Medical History:  Diagnosis Date  . Migraines   . Sarcoidosis   . Thyroid nodule     Family History: Family History  Problem Relation Age of Onset  . Hyperlipidemia Mother   . Hypertension Mother     Social History   Socioeconomic History  . Marital status: Married    Spouse name: Not on file  . Number of children: Not on file  . Years of education: Not on file  . Highest education level: Not on file  Occupational History  . Not on file  Tobacco Use  . Smoking status: Never Smoker  . Smokeless tobacco: Never Used  Substance and Sexual Activity  . Alcohol use: Yes    Comment: ocassionally  . Drug use: Never  . Sexual activity: Not on file  Other Topics Concern  . Not on file  Social  History Narrative  . Not on file   Social Determinants of Health   Financial Resource Strain: Not on file  Food Insecurity: Not on file  Transportation Needs: Not on file  Physical Activity: Not on file  Stress: Not on file  Social Connections: Not on file  Intimate Partner Violence: Not on file      Review of Systems  Constitutional: Negative for chills, fatigue and unexpected weight change.  HENT: Negative for congestion, rhinorrhea, sneezing and sore throat.   Eyes: Negative for redness.  Respiratory: Negative for cough, chest tightness and shortness of breath.   Cardiovascular: Negative  for chest pain and palpitations.  Gastrointestinal: Negative for abdominal pain, constipation, diarrhea, nausea and vomiting.  Genitourinary: Negative for dysuria and frequency.  Musculoskeletal: Negative for arthralgias, back pain, joint swelling and neck pain.  Skin: Negative for rash.  Neurological: Negative.  Negative for tremors and numbness.  Hematological: Negative for adenopathy. Does not bruise/bleed easily.  Psychiatric/Behavioral: Negative for behavioral problems (Depression), sleep disturbance and suicidal ideas. The patient is not nervous/anxious.     Vital Signs: BP 110/84   Pulse 85   Temp 98 F (36.7 C)   Resp 16   Ht 5' 8.5" (1.74 m)   Wt 198 lb 6.4 oz (90 kg)   SpO2 98%   BMI 29.73 kg/m    Physical Exam Vitals reviewed.  Constitutional:      Appearance: Normal appearance.  HENT:     Head: Normocephalic and atraumatic.  Cardiovascular:     Rate and Rhythm: Normal rate and regular rhythm.     Pulses: Normal pulses.     Heart sounds: Normal heart sounds.  Pulmonary:     Effort: Pulmonary effort is normal.     Breath sounds: Normal breath sounds.  Skin:    General: Skin is warm and dry.     Capillary Refill: Capillary refill takes less than 2 seconds.  Neurological:     Mental Status: She is alert and oriented to person, place, and time.  Psychiatric:        Mood and Affect: Mood normal.        Behavior: Behavior normal.    Assessment/Plan: 1. Varicose veins of bilateral lower extremities with pain Dilated veins in bilateral lower legs causing pain, referral to vascular surgery ordered. - Ambulatory referral to Vascular Surgery  2. BMI 29.0-29.9,adult Taking topiramate which is helping. Was taking phentermine, wants to start taking phentermine again. Phentermine ordered, topiramate refilled. Weight check in 4 weeks.  - topiramate (TOPAMAX) 25 MG tablet; TAKE 1 TABLET(25 MG) BY MOUTH TWICE DAILY  Dispense: 45 tablet; Refill: 0 - phentermine 15 MG  capsule; Take 1 capsule (15 mg total) by mouth 2 (two) times daily with breakfast and lunch.  Dispense: 60 capsule; Refill: 0  3. Elevated blood pressure reading History of elevated blood pressure reading, she has been taking hydrochlorothiazide, refill ordered.  - hydrochlorothiazide (HYDRODIURIL) 12.5 MG tablet; Take 1 tablet (12.5 mg total) by mouth daily.  Dispense: 30 tablet; Refill: 3  4. Sarcoidosis Stable, followed by rheumatology  5. Yeast vaginitis History of recurrent yeast infections, refill of diflucan ordered at patient's request to have on hand if she has another yeast infection.  - fluconazole (DIFLUCAN) 150 MG tablet; Take one tab once a week for 4 weeks  Dispense: 4 tablet; Refill: 3    General Counseling: Holleigh verbalizes understanding of the findings of todays visit and agrees with plan of treatment. I have discussed  any further diagnostic evaluation that may be needed or ordered today. We also reviewed her medications today. she has been encouraged to call the office with any questions or concerns that should arise related to todays visit.    Orders Placed This Encounter  Procedures  . Ambulatory referral to Vascular Surgery    Meds ordered this encounter  Medications  . fluconazole (DIFLUCAN) 150 MG tablet    Sig: Take one tab once a week for 4 weeks    Dispense:  4 tablet    Refill:  3  . topiramate (TOPAMAX) 25 MG tablet    Sig: TAKE 1 TABLET(25 MG) BY MOUTH TWICE DAILY    Dispense:  45 tablet    Refill:  0  . phentermine 15 MG capsule    Sig: Take 1 capsule (15 mg total) by mouth 2 (two) times daily with breakfast and lunch.    Dispense:  60 capsule    Refill:  0    Order Specific Question:   Supervising Provider    Answer:   Lavera Guise [[id number]]  . hydrochlorothiazide (HYDRODIURIL) 12.5 MG tablet    Sig: Take 1 tablet (12.5 mg total) by mouth daily.    Dispense:  30 tablet    Refill:  3  . DISCONTD: famciclovir (FAMVIR) 500 MG tablet    Sig: Take  1 tablet (500 mg total) by mouth 2 (two) times daily.    Dispense:  45 tablet    Refill:  0    Return in about 4 weeks (around 03/06/2021) for Weight loss, Elyse Prevo PCP.   Total time spent:30 Minutes Time spent includes review of chart, medications, test results, and follow up plan with the patient.   [place] Controlled Substance Database was reviewed by me.  This patient was seen by Jonetta Osgood, FNP-C in collaboration with Dr. Clayborn Bigness as a part of collaborative care agreement.   Jermia Rigsby R. Valetta Fuller, MSN, FNP-C Internal medicine

## 2021-02-14 ENCOUNTER — Ambulatory Visit: Payer: Self-pay | Admitting: Internal Medicine

## 2021-02-14 ENCOUNTER — Other Ambulatory Visit: Payer: Self-pay

## 2021-02-14 DIAGNOSIS — Z411 Encounter for cosmetic surgery: Secondary | ICD-10-CM

## 2021-02-20 ENCOUNTER — Telehealth (INDEPENDENT_AMBULATORY_CARE_PROVIDER_SITE_OTHER): Payer: Self-pay

## 2021-02-20 NOTE — Telephone Encounter (Signed)
Documentation only.

## 2021-02-28 NOTE — Progress Notes (Signed)
Pt is seen for cosmetic procedure  Consent for Botox is obtained. Area of concern is clean and treated with 30 units of Botox in the glabellar and the frontalis region she tolerated the procedure well instruction for aftercare is also given which this patient is not to do any heavy lifting or exercise and also lay down for the next 4 hours

## 2021-03-04 ENCOUNTER — Other Ambulatory Visit: Payer: Self-pay | Admitting: Nurse Practitioner

## 2021-03-04 DIAGNOSIS — Z6829 Body mass index (BMI) 29.0-29.9, adult: Secondary | ICD-10-CM

## 2021-03-05 ENCOUNTER — Ambulatory Visit: Payer: BC Managed Care – PPO | Admitting: Nurse Practitioner

## 2021-03-05 ENCOUNTER — Other Ambulatory Visit: Payer: Self-pay

## 2021-03-05 ENCOUNTER — Encounter: Payer: Self-pay | Admitting: Nurse Practitioner

## 2021-03-05 VITALS — BP 126/86 | HR 82 | Temp 98.7°F | Resp 16 | Ht 69.0 in | Wt 203.2 lb

## 2021-03-05 DIAGNOSIS — Z6830 Body mass index (BMI) 30.0-30.9, adult: Secondary | ICD-10-CM

## 2021-03-05 DIAGNOSIS — R7301 Impaired fasting glucose: Secondary | ICD-10-CM

## 2021-03-05 NOTE — Progress Notes (Signed)
[facility] [place], [place] [address]  Internal MEDICINE  Office Visit Note  Patient Name: Susan Griffith  [id number]  [id number]  Date of Service: 03/10/2021  Chief Complaint  Patient presents with   Follow-up    Weight lost    HPI Susan Griffith presents for a follow up visit for weight loss management. She has been taking phentermine but has gained 5 lbs since her last visit. She has started working with a Physiological scientist in the gym. She is swimming for low impact cardio. Her muscles seem to be tightening up because she reports her clothes are fitting better.  She reports incorporating good proteins and vegetables into her diet. She is limiting sweets and carbs and breads.   -discussed the limitations of using phentermine since it is a stimulant and appetite suppressant. Susan Griffith is interested in trying something different that could work better for her.   Current Medication: Outpatient Encounter Medications as of 03/05/2021  Medication Sig   [START ON 04/02/2021] Semaglutide (RYBELSUS) 3 MG TABS Take 3 mg by mouth daily before breakfast. Take by mouth first thing in the morning on a empty stomach with no more than 4 oz of water. May eat, drink and take other medications at least 30 minutes after taking Rybelsus.   albuterol (VENTOLIN HFA) 108 (90 Base) MCG/ACT inhaler Inhale 2 puffs into the lungs every 6 (six) hours as needed for wheezing or shortness of breath.   fluconazole (DIFLUCAN) 150 MG tablet Take one tab once a week for 4 weeks   hydrochlorothiazide (HYDRODIURIL) 12.5 MG tablet Take 1 tablet (12.5 mg total) by mouth daily.   [DISCONTINUED] phentermine 15 MG capsule Take 1 capsule (15 mg total) by mouth 2 (two) times daily with breakfast and lunch.   [DISCONTINUED] topiramate (TOPAMAX) 25 MG tablet TAKE 1 TABLET(25 MG) BY MOUTH TWICE DAILY   No facility-administered encounter medications on file as of 03/05/2021.    Surgical History: Past Surgical History:   Procedure Laterality Date   child birth     natural   thigh surgery  10/22/2018    Medical History: Past Medical History:  Diagnosis Date   Migraines    Sarcoidosis    Thyroid nodule     Family History: Family History  Problem Relation Age of Onset   Hyperlipidemia Mother    Hypertension Mother     Social History   Socioeconomic History   Marital status: Married    Spouse name: Not on file   Number of children: Not on file   Years of education: Not on file   Highest education level: Not on file  Occupational History   Not on file  Tobacco Use   Smoking status: Never   Smokeless tobacco: Never  Substance and Sexual Activity   Alcohol use: Yes    Comment: ocassionally   Drug use: Never   Sexual activity: Not on file  Other Topics Concern   Not on file  Social History Narrative   Not on file   Social Determinants of Health   Financial Resource Strain: Not on file  Food Insecurity: Not on file  Transportation Needs: Not on file  Physical Activity: Not on file  Stress: Not on file  Social Connections: Not on file  Intimate Partner Violence: Not on file      Review of Systems  Constitutional:  Negative for chills, fatigue and unexpected weight change.  HENT:  Negative for congestion, rhinorrhea, sneezing and sore throat.  Eyes:  Negative for redness.  Respiratory:  Negative for cough, chest tightness and shortness of breath.   Cardiovascular:  Negative for chest pain and palpitations.  Gastrointestinal:  Negative for abdominal pain, constipation, diarrhea, nausea and vomiting.  Genitourinary:  Negative for dysuria and frequency.  Musculoskeletal:  Negative for arthralgias, back pain, joint swelling and neck pain.  Skin:  Negative for rash.  Neurological: Negative.  Negative for tremors and numbness.  Hematological:  Negative for adenopathy. Does not bruise/bleed easily.  Psychiatric/Behavioral:  Negative for behavioral problems (Depression), sleep  disturbance and suicidal ideas. The patient is not nervous/anxious.    Vital Signs: BP 126/86   Pulse 82   Temp 98.7 F (37.1 C)   Resp 16   Ht 5\' 9"  (1.753 m)   Wt 203 lb 3.2 oz (92.2 kg)   SpO2 99%   BMI 30.01 kg/m    Physical Exam Vitals reviewed.  Constitutional:      Appearance: Normal appearance. She is obese.  HENT:     Head: Normocephalic and atraumatic.  Cardiovascular:     Rate and Rhythm: Normal rate and regular rhythm.     Pulses: Normal pulses.     Heart sounds: Normal heart sounds.  Pulmonary:     Effort: Pulmonary effort is normal.     Breath sounds: Normal breath sounds.  Skin:    General: Skin is warm and dry.     Capillary Refill: Capillary refill takes less than 2 seconds.  Neurological:     Mental Status: She is alert and oriented to person, place, and time.  Psychiatric:        Mood and Affect: Mood normal.        Behavior: Behavior normal.     Assessment/Plan: 1. Body mass index (BMI) 30.0-30.9, adult Patient is doing well working on lifestyle and diet modifications. No significant progress has been seen yet but she is continuing her physical activity, healthy eating and will be starting Rybelsus. Rybelsus 1 month sample provided. Side effects discussed.  - Semaglutide (RYBELSUS) 3 MG TABS; Take 3 mg by mouth daily before breakfast. Take by mouth first thing in the morning on a empty stomach with no more than 4 oz of water. May eat, drink and take other medications at least 30 minutes after taking Rybelsus.  Dispense: 30 tablet; Refill: 2  2. Impaired fasting glucose She has a history of impaired fasting glucose on previous metabolic panels, Rybelsus prescribed.  - Semaglutide (RYBELSUS) 3 MG TABS; Take 3 mg by mouth daily before breakfast. Take by mouth first thing in the morning on a empty stomach with no more than 4 oz of water. May eat, drink and take other medications at least 30 minutes after taking Rybelsus.  Dispense: 30 tablet; Refill:  2   General Counseling: Susan Griffith verbalizes understanding of the findings of todays visit and agrees with plan of treatment. I have discussed any further diagnostic evaluation that may be needed or ordered today. We also reviewed her medications today. she has been encouraged to call the office with any questions or concerns that should arise related to todays visit.    No orders of the defined types were placed in this encounter.   Meds ordered this encounter  Medications   Semaglutide (RYBELSUS) 3 MG TABS    Sig: Take 3 mg by mouth daily before breakfast. Take by mouth first thing in the morning on a empty stomach with no more than 4 oz of water. May eat,  drink and take other medications at least 30 minutes after taking Rybelsus.    Dispense:  30 tablet    Refill:  2     Return in about 6 weeks (around 04/16/2021) for F/U, Weight loss, Haim Hansson PCP.   Total time spent:30 Minutes Time spent includes review of chart, medications, test results, and follow up plan with the patient.   [place] Controlled Substance Database was reviewed by me.  This patient was seen by Jonetta Osgood, FNP-C in collaboration with Dr. Clayborn Bigness as a part of collaborative care agreement.   Bradi Arbuthnot R. Valetta Fuller, MSN, FNP-C Internal medicine

## 2021-03-07 ENCOUNTER — Other Ambulatory Visit: Payer: Self-pay | Admitting: Nurse Practitioner

## 2021-03-07 ENCOUNTER — Ambulatory Visit: Payer: BC Managed Care – PPO | Admitting: Internal Medicine

## 2021-03-07 DIAGNOSIS — Z6829 Body mass index (BMI) 29.0-29.9, adult: Secondary | ICD-10-CM

## 2021-03-10 MED ORDER — RYBELSUS 3 MG PO TABS: 3.0000 mg | ORAL_TABLET | Freq: Every day | ORAL | 2 refills | Status: DC

## 2021-03-14 ENCOUNTER — Telehealth: Payer: Self-pay

## 2021-03-14 NOTE — Telephone Encounter (Signed)
Left vm to reschedule 03/07/21 missed appointment-Toni

## 2021-03-19 ENCOUNTER — Telehealth: Payer: Self-pay

## 2021-03-19 NOTE — Telephone Encounter (Signed)
Patient has been scheduled at [facility] on 03/28/21 at 8:30a. Susan Griffith

## 2021-03-22 ENCOUNTER — Ambulatory Visit: Payer: Self-pay | Admitting: Internal Medicine

## 2021-03-22 ENCOUNTER — Other Ambulatory Visit: Payer: Self-pay

## 2021-03-22 DIAGNOSIS — Z411 Encounter for cosmetic surgery: Secondary | ICD-10-CM

## 2021-03-22 NOTE — Progress Notes (Signed)
Pt is here for cosmetic procedure

## 2021-03-27 ENCOUNTER — Other Ambulatory Visit (INDEPENDENT_AMBULATORY_CARE_PROVIDER_SITE_OTHER): Payer: Self-pay | Admitting: Nurse Practitioner

## 2021-03-27 DIAGNOSIS — I83813 Varicose veins of bilateral lower extremities with pain: Secondary | ICD-10-CM

## 2021-03-28 ENCOUNTER — Other Ambulatory Visit: Payer: Self-pay | Admitting: Internal Medicine

## 2021-03-28 ENCOUNTER — Other Ambulatory Visit: Payer: Self-pay

## 2021-03-28 ENCOUNTER — Ambulatory Visit (INDEPENDENT_AMBULATORY_CARE_PROVIDER_SITE_OTHER): Payer: BC Managed Care – PPO

## 2021-03-28 ENCOUNTER — Encounter (INDEPENDENT_AMBULATORY_CARE_PROVIDER_SITE_OTHER): Payer: Self-pay | Admitting: Nurse Practitioner

## 2021-03-28 ENCOUNTER — Ambulatory Visit (INDEPENDENT_AMBULATORY_CARE_PROVIDER_SITE_OTHER): Payer: BC Managed Care – PPO | Admitting: Nurse Practitioner

## 2021-03-28 VITALS — BP 127/85 | HR 66 | Ht 69.0 in | Wt 201.0 lb

## 2021-03-28 DIAGNOSIS — J4521 Mild intermittent asthma with (acute) exacerbation: Secondary | ICD-10-CM

## 2021-03-28 DIAGNOSIS — I83813 Varicose veins of bilateral lower extremities with pain: Secondary | ICD-10-CM

## 2021-03-28 DIAGNOSIS — I10 Essential (primary) hypertension: Secondary | ICD-10-CM

## 2021-03-28 DIAGNOSIS — Z1231 Encounter for screening mammogram for malignant neoplasm of breast: Secondary | ICD-10-CM

## 2021-03-28 NOTE — Progress Notes (Signed)
Subjective:    Patient ID: Susan Griffith, female    DOB: November 27, 1965, 55 y.o.   MRN: [id number] Chief Complaint  Patient presents with   New Patient (Initial Visit)    NP Dr Valetta Fuller Dx [facility] BIL LE W. Pain     Susan Griffith is a 55 year old female that presents today for follow-up and evaluation for symptomatic varicose veins.  The patient has previously had an endovenous ablation on her bilateral great saphenous veins..  The patient note significant improvement in the lower extremity pain but not resolution of the symptoms. The patient notes multiple residual varicosities bilaterally which continued to hurt with dependent positions and remained tender to palpation. The patient's swelling is minimally from preoperative status. The patient continues to wear graduated compression stockings on a daily basis but these are not eliminating the pain and discomfort. The patient continues to use over-the-counter anti-inflammatory medications to treat the pain and related symptoms but this has not given the patient relief. The patient notes the pain in the lower extremities is causing problems with daily exercise, problems at work and even with household activities such as preparing meals and doing dishes.  The patient is otherwise done well and there have been no complications related to the laser procedure or interval changes in the patient's overall   Today noninvasive studies show evidence of prior ablation of the bilateral great saphenous veins.  There is no evidence of DVT or superficial thrombophlebitis..  The patient has evidence of deep venous insufficiency bilaterally.  The right popliteal vein has evidence of deep venous insufficiency.  Left common femoral vein has evidence of deep venous insufficiency.  The left great saphenous vein at the saphenofemoral junction has evidence of venous reflux     Review of Systems  Cardiovascular:  Positive for leg swelling.   Hematological:  Bruises/bleeds easily.  All other systems reviewed and are negative.     Objective:   Physical Exam Vitals reviewed.  HENT:     Head: Normocephalic.  Cardiovascular:     Rate and Rhythm: Normal rate.     Pulses: Normal pulses.  Pulmonary:     Effort: Pulmonary effort is normal.  Musculoskeletal:     Right lower leg: Edema present.     Left lower leg: Edema present.  Skin:    General: Skin is warm and dry.     Comments: Notable spider varicosities bilaterally with some larger palpable veins in the bilateral calf areas.  Neurological:     Mental Status: She is alert and oriented to person, place, and time.  Psychiatric:        Mood and Affect: Mood normal.        Behavior: Behavior normal.        Thought Content: Thought content normal.        Judgment: Judgment normal.    BP 127/85   Pulse 66   Ht 5\' 9"  (1.753 m)   Wt 201 lb (91.2 kg)   BMI 29.68 kg/m   Past Medical History:  Diagnosis Date   Migraines    Sarcoidosis    Thyroid nodule     Social History   Socioeconomic History   Marital status: Married    Spouse name: Not on file   Number of children: Not on file   Years of education: Not on file   Highest education level: Not on file  Occupational History   Not on file  Tobacco Use   Smoking status: Never  Smokeless tobacco: Never  Substance and Sexual Activity   Alcohol use: Yes    Comment: ocassionally   Drug use: Never   Sexual activity: Not on file  Other Topics Concern   Not on file  Social History Narrative   Not on file   Social Determinants of Health   Financial Resource Strain: Not on file  Food Insecurity: Not on file  Transportation Needs: Not on file  Physical Activity: Not on file  Stress: Not on file  Social Connections: Not on file  Intimate Partner Violence: Not on file    Past Surgical History:  Procedure Laterality Date   child birth     natural   thigh surgery  10/22/2018    Family History   Problem Relation Age of Onset   Hyperlipidemia Mother    Hypertension Mother     Allergies  Allergen Reactions   Keflex [Cephalexin]    Latex     CBC Latest Ref Rng & Units 04/01/2019 06/25/2018  WBC 3.4 - 10.8 x10E3/uL 7.6 5.6  Hemoglobin 11.1 - 15.9 g/dL 15.0 14.1  Hematocrit 34.0 - 46.6 % 44.6 42.9  Platelets 150 - 450 x10E3/uL 327 336      CMP     Component Value Date/Time   NA 138 04/01/2019 1202   K 4.3 04/01/2019 1202   CL 94 (L) 04/01/2019 1202   CO2 28 04/01/2019 1202   GLUCOSE 144 (H) 04/01/2019 1202   BUN 15 04/01/2019 1202   CREATININE 0.96 04/01/2019 1202   CALCIUM 10.0 04/01/2019 1202   PROT 7.1 04/01/2019 1202   ALBUMIN 4.8 04/01/2019 1202   AST 18 04/01/2019 1202   ALT 18 04/01/2019 1202   ALKPHOS 102 04/01/2019 1202   BILITOT 0.3 04/01/2019 1202   GFRNONAA 68 04/01/2019 1202   GFRAA 78 04/01/2019 1202     No results found.     Assessment & Plan:   1. Varicose veins of bilateral lower extremities with pain Recommend:  The patient has had successful ablation of the previously incompetent saphenous venous system but still has persistent symptoms of pain and swelling that are having a negative impact on daily life and daily activities.  Patient should undergo injection sclerotherapy to treat the residual varicosities.  The risks, benefits and alternative therapies were reviewed in detail with the patient.  All questions were answered.  The patient agrees to proceed with sclerotherapy at their convenience.  The patient will continue wearing the graduated compression stockings and using the over-the-counter pain medications to treat her symptoms.       2. Benign hypertension Continue antihypertensive medications as already ordered, these medications have been reviewed and there are no changes at this time.   3. Mild intermittent asthma with (acute) exacerbation Continue pulmonary medications and aerosols as already ordered, these medications  have been reviewed and there are no changes at this time.     Current Outpatient Medications on File Prior to Visit  Medication Sig Dispense Refill   albuterol (VENTOLIN HFA) 108 (90 Base) MCG/ACT inhaler Inhale 2 puffs into the lungs every 6 (six) hours as needed for wheezing or shortness of breath. 18 g 2   hydrochlorothiazide (HYDRODIURIL) 12.5 MG tablet Take 1 tablet (12.5 mg total) by mouth daily. 30 tablet 3   [START ON 04/02/2021] Semaglutide (RYBELSUS) 3 MG TABS Take 3 mg by mouth daily before breakfast. Take by mouth first thing in the morning on a empty stomach with no more than 4 oz of  water. May eat, drink and take other medications at least 30 minutes after taking Rybelsus. 30 tablet 2   topiramate (TOPAMAX) 25 MG tablet TAKE 1 TABLET(25 MG) BY MOUTH TWICE DAILY 184 tablet 1   fluconazole (DIFLUCAN) 150 MG tablet Take one tab once a week for 4 weeks (Patient not taking: Reported on 03/28/2021) 4 tablet 3   No current facility-administered medications on file prior to visit.    There are no Patient Instructions on file for this visit. No follow-ups on file.   Kris Hartmann, NP

## 2021-04-16 ENCOUNTER — Other Ambulatory Visit: Payer: Self-pay

## 2021-04-16 ENCOUNTER — Encounter: Payer: Self-pay | Admitting: Physician Assistant

## 2021-04-16 ENCOUNTER — Ambulatory Visit: Payer: BC Managed Care – PPO | Admitting: Nurse Practitioner

## 2021-04-16 VITALS — BP 106/80 | HR 79 | Temp 98.6°F | Resp 16 | Ht 68.0 in | Wt 199.2 lb

## 2021-04-16 DIAGNOSIS — Z6830 Body mass index (BMI) 30.0-30.9, adult: Secondary | ICD-10-CM | POA: Diagnosis not present

## 2021-04-16 DIAGNOSIS — I10 Essential (primary) hypertension: Secondary | ICD-10-CM | POA: Diagnosis not present

## 2021-04-16 DIAGNOSIS — R7301 Impaired fasting glucose: Secondary | ICD-10-CM

## 2021-04-16 NOTE — Progress Notes (Signed)
[facility] [place], [place] [address]  Internal MEDICINE  Office Visit Note  Patient Name: Susan Griffith  [id number]  [id number]  Date of Service: 04/16/2021  Chief Complaint  Patient presents with   Follow-up    Weight loss    HPI Aldeen presents for a follow up visit for weight loss management. She has lost 2 lbs since her last off visit on 03/28/2021. Her scale at home said she weighted 196.8 lbs this morning. She has started on Rybelsus and was given a month sample for weight loss. The prior authorization was denied for Rybelsus since she does not have diabetes. She does have impaired fasting glucose. Labs will be ordered to assess for athersclerotic cardiovascular disease or and increased risk for it.     Current Medication: Outpatient Encounter Medications as of 04/16/2021  Medication Sig   albuterol (VENTOLIN HFA) 108 (90 Base) MCG/ACT inhaler Inhale 2 puffs into the lungs every 6 (six) hours as needed for wheezing or shortness of breath.   hydrochlorothiazide (HYDRODIURIL) 12.5 MG tablet Take 1 tablet (12.5 mg total) by mouth daily.   Semaglutide (RYBELSUS) 3 MG TABS Take 3 mg by mouth daily before breakfast. Take by mouth first thing in the morning on a empty stomach with no more than 4 oz of water. May eat, drink and take other medications at least 30 minutes after taking Rybelsus.   topiramate (TOPAMAX) 25 MG tablet TAKE 1 TABLET(25 MG) BY MOUTH TWICE DAILY   fluconazole (DIFLUCAN) 150 MG tablet Take one tab once a week for 4 weeks (Patient not taking: No sig reported)   No facility-administered encounter medications on file as of 04/16/2021.    Surgical History: Past Surgical History:  Procedure Laterality Date   child birth     natural   thigh surgery  10/22/2018    Medical History: Past Medical History:  Diagnosis Date   Migraines    Sarcoidosis    Thyroid nodule     Family History: Family History  Problem Relation Age  of Onset   Hyperlipidemia Mother    Hypertension Mother     Social History   Socioeconomic History   Marital status: Married    Spouse name: Not on file   Number of children: Not on file   Years of education: Not on file   Highest education level: Not on file  Occupational History   Not on file  Tobacco Use   Smoking status: Never   Smokeless tobacco: Never  Substance and Sexual Activity   Alcohol use: Yes    Comment: ocassionally   Drug use: Never   Sexual activity: Not on file  Other Topics Concern   Not on file  Social History Narrative   Not on file   Social Determinants of Health   Financial Resource Strain: Not on file  Food Insecurity: Not on file  Transportation Needs: Not on file  Physical Activity: Not on file  Stress: Not on file  Social Connections: Not on file  Intimate Partner Violence: Not on file      Review of Systems  Constitutional:  Negative for chills, fatigue and unexpected weight change.  HENT:  Negative for congestion, rhinorrhea, sneezing and sore throat.   Eyes:  Negative for redness.  Respiratory:  Negative for cough, chest tightness and shortness of breath.   Cardiovascular:  Negative for chest pain and palpitations.  Gastrointestinal:  Negative for abdominal pain, constipation, diarrhea, nausea and vomiting.  Genitourinary:  Negative for dysuria and frequency.  Musculoskeletal:  Negative for arthralgias, back pain, joint swelling and neck pain.  Skin:  Negative for rash.  Neurological: Negative.  Negative for tremors and numbness.  Hematological:  Negative for adenopathy. Does not bruise/bleed easily.  Psychiatric/Behavioral:  Negative for behavioral problems (Depression), sleep disturbance and suicidal ideas. The patient is not nervous/anxious.    Vital Signs: BP 106/80   Pulse 79   Temp 98.6 F (37 C)   Resp 16   Ht $R'5\' 8"'Ne$  (1.727 m)   Wt 199 lb 3.2 oz (90.4 kg)   SpO2 98%   BMI 30.29 kg/m    Physical Exam Vitals  reviewed.  Constitutional:      General: She is not in acute distress.    Appearance: She is well-developed. She is not diaphoretic.  HENT:     Head: Normocephalic and atraumatic.     Mouth/Throat:     Pharynx: No oropharyngeal exudate.  Eyes:     Pupils: Pupils are equal, round, and reactive to light.  Neck:     Thyroid: No thyromegaly.     Vascular: No JVD.     Trachea: No tracheal deviation.  Cardiovascular:     Rate and Rhythm: Normal rate and regular rhythm.     Heart sounds: No murmur heard.   No friction rub. No gallop.  Pulmonary:     Effort: Pulmonary effort is normal. No respiratory distress.  Abdominal:     General: Bowel sounds are normal.     Palpations: Abdomen is soft.  Musculoskeletal:        General: Normal range of motion.     Cervical back: Normal range of motion and neck supple.  Lymphadenopathy:     Cervical: No cervical adenopathy.  Skin:    General: Skin is warm and dry.  Neurological:     Mental Status: She is alert and oriented to person, place, and time.     Cranial Nerves: No cranial nerve deficit.  Psychiatric:        Behavior: Behavior normal.        Thought Content: Thought content normal.        Judgment: Judgment normal.     Assessment/Plan: 1. Impaired fasting glucose Labs ordered to assess glucose level, thyroid function and cholesterol levels.  - Lipid Profile - TSH + free T4 - CMP14+EGFR  2. Body mass index (BMI) 30.0-30.9, adult Working on weight loss, 1 month sample of Rybelsus $RemoveBef'3mg'ZcdkjTnjfw$  provided to patient today while figuring out the prior authorization for the medication.   3. Essential hypertension CBC ordered. Taking hydrochlorothiazide, blood pressure well controlled.  - CBC with Differential/Platelet   General Counseling: Teliah verbalizes understanding of the findings of todays visit and agrees with plan of treatment. I have discussed any further diagnostic evaluation that may be needed or ordered today. We also reviewed her  medications today. she has been encouraged to call the office with any questions or concerns that should arise related to todays visit.    Orders Placed This Encounter  Procedures   CBC with Differential/Platelet   Lipid Profile   TSH + free T4   CMP14+EGFR    No orders of the defined types were placed in this encounter.   Return in about 4 weeks (around 05/14/2021) for F/U, Weight loss, Review labs/test, Vicki Pasqual PCP.   Total time spent: 20 Minutes Time spent includes review of chart, medications, test results, and follow up plan with the patient.   [place]  Controlled Substance Database was reviewed by me.  This patient was seen by Jonetta Osgood, FNP-C in collaboration with Dr. Clayborn Bigness as a part of collaborative care agreement.   Celsey Asselin R. Valetta Fuller, MSN, FNP-C Internal medicine

## 2021-04-18 ENCOUNTER — Telehealth (INDEPENDENT_AMBULATORY_CARE_PROVIDER_SITE_OTHER): Payer: Self-pay | Admitting: Nurse Practitioner

## 2021-04-18 NOTE — Telephone Encounter (Signed)
Documentation only.

## 2021-05-08 ENCOUNTER — Other Ambulatory Visit: Payer: Self-pay

## 2021-05-08 ENCOUNTER — Ambulatory Visit
Admission: RE | Admit: 2021-05-08 | Discharge: 2021-05-08 | Disposition: A | Discharge status: Home / Self Care | Payer: BC Managed Care – PPO | Source: Ambulatory Visit | Attending: Internal Medicine | Admitting: Internal Medicine

## 2021-05-08 DIAGNOSIS — Z1231 Encounter for screening mammogram for malignant neoplasm of breast: Secondary | ICD-10-CM

## 2021-05-15 ENCOUNTER — Ambulatory Visit (INDEPENDENT_AMBULATORY_CARE_PROVIDER_SITE_OTHER): Payer: BC Managed Care – PPO | Admitting: Internal Medicine

## 2021-05-15 ENCOUNTER — Encounter: Payer: Self-pay | Admitting: Internal Medicine

## 2021-05-15 ENCOUNTER — Other Ambulatory Visit: Payer: Self-pay

## 2021-05-15 ENCOUNTER — Telehealth: Payer: Self-pay

## 2021-05-15 VITALS — BP 124/90 | HR 88 | Temp 98.3°F | Resp 16 | Ht 68.75 in | Wt 192.4 lb

## 2021-05-15 DIAGNOSIS — D869 Sarcoidosis, unspecified: Secondary | ICD-10-CM

## 2021-05-15 DIAGNOSIS — I10 Essential (primary) hypertension: Secondary | ICD-10-CM | POA: Diagnosis not present

## 2021-05-15 DIAGNOSIS — E2839 Other primary ovarian failure: Secondary | ICD-10-CM | POA: Diagnosis not present

## 2021-05-15 DIAGNOSIS — R300 Dysuria: Secondary | ICD-10-CM | POA: Diagnosis not present

## 2021-05-15 DIAGNOSIS — Z0001 Encounter for general adult medical examination with abnormal findings: Secondary | ICD-10-CM

## 2021-05-15 DIAGNOSIS — Z6828 Body mass index (BMI) 28.0-28.9, adult: Secondary | ICD-10-CM

## 2021-05-15 DIAGNOSIS — G43809 Other migraine, not intractable, without status migrainosus: Secondary | ICD-10-CM

## 2021-05-15 MED ORDER — PHENTERMINE HCL 37.5 MG PO CAPS: 37.5000 mg | ORAL_CAPSULE | ORAL | 0 refills | Status: DC

## 2021-05-15 NOTE — Telephone Encounter (Signed)
Left vm letting patient know her dexa is scheduled for 05/17/21 @ 2:20-Toni

## 2021-05-15 NOTE — Progress Notes (Signed)
[facility] [place], [place] [address]  Internal MEDICINE  Office Visit Note  Patient Name: Susan Griffith  [id number]  [id number]  Date of Service: 05/15/2021  Chief Complaint  Patient presents with   Annual Exam   Medical Management of Chronic Issues    Weight managment   Hypothyroidism     HPI Pt is here for routine health maintenance examination - pt has been doing well with her wt loss program. She has lost over 10 lbs in last 4 months.  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 - Headaches are better on Topmax  - Needs BMD  - Mammogram is normal - Cologaurd was negative    Current Medication: Outpatient Encounter Medications as of 05/15/2021  Medication Sig   albuterol (VENTOLIN HFA) 108 (90 Base) MCG/ACT inhaler Inhale 2 puffs into the lungs every 6 (six) hours as needed for wheezing or shortness of breath.   hydrochlorothiazide (HYDRODIURIL) 12.5 MG tablet Take 1 tablet (12.5 mg total) by mouth daily.   phentermine 37.5 MG capsule Take 1 capsule (37.5 mg total) by mouth every morning.   topiramate (TOPAMAX) 25 MG tablet TAKE 1 TABLET(25 MG) BY MOUTH TWICE DAILY   [DISCONTINUED] fluconazole (DIFLUCAN) 150 MG tablet Take one tab once a week for 4 weeks (Patient not taking: No sig reported)   [DISCONTINUED] Semaglutide (RYBELSUS) 3 MG TABS Take 3 mg by mouth daily before breakfast. Take by mouth first thing in the morning on a empty stomach with no more than 4 oz of water. May eat, drink and take other medications at least 30 minutes after taking Rybelsus. (Patient not taking: Reported on 05/15/2021)   No facility-administered encounter medications on file as of 05/15/2021.    Surgical History: Past Surgical History:  Procedure Laterality Date   child birth     natural   thigh surgery  10/22/2018    Medical History: Past Medical History:  Diagnosis Date   Migraines    Sarcoidosis     Thyroid nodule     Family History: Family History  Problem Relation Age of Onset   Hyperlipidemia Mother    Hypertension Mother     Social History: Social History   Socioeconomic History   Marital status: Married    Spouse name: Not on file   Number of children: Not on file   Years of education: Not on file   Highest education level: Not on file  Occupational History   Not on file  Tobacco Use   Smoking status: Never   Smokeless tobacco: Never  Substance and Sexual Activity   Alcohol use: Yes    Comment: ocassionally   Drug use: Never   Sexual activity: Not on file  Other Topics Concern   Not on file  Social History Narrative   Not on file   Social Determinants of Health   Financial Resource Strain: Not on file  Food Insecurity: Not on file  Transportation Needs: Not on file  Physical Activity: Not on file  Stress: Not on file  Social Connections: Not on file      Review of Systems  Constitutional:  Negative for chills, fatigue and unexpected weight change.  HENT:  Negative for congestion, postnasal drip, rhinorrhea, sneezing and sore throat.   Eyes:  Negative for redness.  Respiratory:  Negative for cough, chest tightness and shortness of breath.   Cardiovascular:  Negative for chest pain and  palpitations.  Gastrointestinal:  Negative for abdominal pain, constipation, diarrhea, nausea and vomiting.  Genitourinary:  Negative for dysuria and frequency.  Musculoskeletal:  Negative for arthralgias, back pain, joint swelling and neck pain.  Skin:  Negative for rash.  Neurological: Negative.  Negative for tremors and numbness.  Hematological:  Negative for adenopathy. Does not bruise/bleed easily.  Psychiatric/Behavioral:  Negative for behavioral problems (Depression), sleep disturbance and suicidal ideas. The patient is not nervous/anxious.     Vital Signs: BP 124/90   Pulse 88   Temp 98.3 F (36.8 C)   Resp 16   Ht 5' 8.75" (1.746 m)   Wt 192 lb 6.4  oz (87.3 kg)   SpO2 98%   BMI 28.62 kg/m    Physical Exam Constitutional:      General: She is not in acute distress.    Appearance: She is well-developed. She is not diaphoretic.  HENT:     Head: Normocephalic and atraumatic.     Right Ear: External ear normal.     Left Ear: External ear normal.     Nose: Nose normal.     Mouth/Throat:     Pharynx: No oropharyngeal exudate.  Eyes:     General: No scleral icterus.       Right eye: No discharge.        Left eye: No discharge.     Conjunctiva/sclera: Conjunctivae normal.     Pupils: Pupils are equal, round, and reactive to light.  Neck:     Thyroid: No thyromegaly.     Vascular: No JVD.     Trachea: No tracheal deviation.  Cardiovascular:     Rate and Rhythm: Normal rate and regular rhythm.     Heart sounds: Normal heart sounds. No murmur heard.   No friction rub. No gallop.  Pulmonary:     Effort: Pulmonary effort is normal. No respiratory distress.     Breath sounds: Normal breath sounds. No stridor. No wheezing or rales.  Chest:     Chest wall: No tenderness.  Breasts:    Right: Normal.     Left: Normal.     Comments: Surgical scar noticed after cosmetic procedure for breast lift  Abdominal:     General: Bowel sounds are normal. There is no distension.     Palpations: Abdomen is soft. There is no mass.     Tenderness: There is no abdominal tenderness. There is no guarding or rebound.  Musculoskeletal:        General: No tenderness or deformity. Normal range of motion.     Cervical back: Normal range of motion and neck supple.  Lymphadenopathy:     Cervical: No cervical adenopathy.  Skin:    General: Skin is warm and dry.     Coloration: Skin is not pale.     Findings: No erythema or rash.  Neurological:     Mental Status: She is alert.     Cranial Nerves: No cranial nerve deficit.     Motor: No abnormal muscle tone.     Coordination: Coordination normal.     Deep Tendon Reflexes: Reflexes are normal and  symmetric.  Psychiatric:        Behavior: Behavior normal.        Thought Content: Thought content normal.        Judgment: Judgment normal.     Assessment/Plan: 1. Encounter for general adult medical examination with abnormal findings On preventive health maintenance is updated today  2. Other migraine  without status migrainosus, not intractable Continue her Topamax as before  3. Sarcoidosis This is stable  4. Other primary ovarian failure Patient is due for bone density - DG Bone Density; Future  5. Benign hypertension Continue hydrochlorothiazide as before  6. BMI 28.0-28.9,adult Patient is doing well with her diet and exercise regimen and routine encouraged her to continue to do so,  add phentermine for 1 more month.  Long-term complications and side effects are discussed for appetite suppressant  7. Dysuria - UA/M w/rflx Culture, Routine  General Counseling: Kandas verbalizes understanding of the findings of todays visit and agrees with plan of treatment. I have discussed any further diagnostic evaluation that may be needed or ordered today. We also reviewed her medications today. she has been encouraged to call the office with any questions or concerns that should arise related to todays visit.    Counseling:  [place] Controlled Substance Database was reviewed by me.  Orders Placed This Encounter  Procedures   UA/M w/rflx Culture, Routine    Meds ordered this encounter  Medications   phentermine 37.5 MG capsule    Sig: Take 1 capsule (37.5 mg total) by mouth every morning.    Dispense:  30 capsule    Refill:  0    Total time spent:30 Minutes  Time spent includes review of chart, medications, test results, and follow up plan with the patient.     Lavera Guise, MD  Internal Medicine

## 2021-05-16 LAB — UA/M W/RFLX CULTURE, ROUTINE
Bilirubin, UA: NEGATIVE
Glucose, UA: NEGATIVE
Ketones, UA: NEGATIVE
Leukocytes,UA: NEGATIVE
Nitrite, UA: NEGATIVE
Protein,UA: NEGATIVE
RBC, UA: NEGATIVE
Specific Gravity, UA: 1.023 (ref 1.005–1.030)
Urobilinogen, Ur: 1 mg/dL (ref 0.2–1.0)
pH, UA: 8 — ABNORMAL HIGH (ref 5.0–7.5)

## 2021-05-16 LAB — MICROSCOPIC EXAMINATION
Bacteria, UA: NONE SEEN
Casts: NONE SEEN /lpf
Epithelial Cells (non renal): NONE SEEN /hpf (ref 0–10)
WBC, UA: NONE SEEN /hpf (ref 0–5)

## 2021-05-17 ENCOUNTER — Other Ambulatory Visit: Payer: BC Managed Care – PPO

## 2021-05-21 ENCOUNTER — Ambulatory Visit: Payer: BC Managed Care – PPO

## 2021-05-29 ENCOUNTER — Encounter: Payer: BC Managed Care – PPO | Admitting: Nurse Practitioner

## 2021-06-04 ENCOUNTER — Encounter: Payer: BC Managed Care – PPO | Admitting: Nurse Practitioner

## 2021-06-06 ENCOUNTER — Other Ambulatory Visit: Payer: Self-pay

## 2021-06-06 ENCOUNTER — Encounter (INDEPENDENT_AMBULATORY_CARE_PROVIDER_SITE_OTHER): Payer: Self-pay | Admitting: Vascular Surgery

## 2021-06-06 ENCOUNTER — Ambulatory Visit (INDEPENDENT_AMBULATORY_CARE_PROVIDER_SITE_OTHER): Payer: BC Managed Care – PPO | Admitting: Vascular Surgery

## 2021-06-06 VITALS — BP 129/83 | HR 67 | Resp 16 | Wt 190.4 lb

## 2021-06-06 DIAGNOSIS — I83813 Varicose veins of bilateral lower extremities with pain: Secondary | ICD-10-CM | POA: Diagnosis not present

## 2021-06-06 NOTE — Progress Notes (Signed)
Varicose veins of bilateral  lower extremity with inflammation (454.1  I83.10) Current Plans   Indication: Patient presents with symptomatic varicose veins of the bilateral  lower extremity.   Procedure: Sclerotherapy using hypertonic saline mixed with 1% Lidocaine was performed on the bilateral lower extremity. Compression wraps were placed. The patient tolerated the procedure well. 

## 2021-06-17 ENCOUNTER — Other Ambulatory Visit: Payer: Self-pay | Admitting: Nurse Practitioner

## 2021-06-17 DIAGNOSIS — R030 Elevated blood-pressure reading, without diagnosis of hypertension: Secondary | ICD-10-CM

## 2021-06-27 ENCOUNTER — Ambulatory Visit (INDEPENDENT_AMBULATORY_CARE_PROVIDER_SITE_OTHER): Payer: BC Managed Care – PPO | Admitting: Vascular Surgery

## 2021-07-09 ENCOUNTER — Ambulatory Visit: Payer: BC Managed Care – PPO | Admitting: Internal Medicine

## 2021-07-10 ENCOUNTER — Ambulatory Visit
Admission: RE | Admit: 2021-07-10 | Discharge: 2021-07-10 | Disposition: A | Discharge status: Home / Self Care | Payer: BC Managed Care – PPO | Source: Ambulatory Visit | Attending: Internal Medicine | Admitting: Internal Medicine

## 2021-07-10 ENCOUNTER — Other Ambulatory Visit: Payer: Self-pay

## 2021-07-10 DIAGNOSIS — E2839 Other primary ovarian failure: Secondary | ICD-10-CM | POA: Insufficient documentation

## 2021-07-10 DIAGNOSIS — Z780 Asymptomatic menopausal state: Secondary | ICD-10-CM | POA: Diagnosis not present

## 2021-07-11 ENCOUNTER — Ambulatory Visit (INDEPENDENT_AMBULATORY_CARE_PROVIDER_SITE_OTHER): Payer: BC Managed Care – PPO | Admitting: Vascular Surgery

## 2021-07-11 ENCOUNTER — Encounter (INDEPENDENT_AMBULATORY_CARE_PROVIDER_SITE_OTHER): Payer: Self-pay | Admitting: Vascular Surgery

## 2021-07-11 VITALS — BP 123/80 | HR 78 | Resp 16 | Wt 185.8 lb

## 2021-07-11 DIAGNOSIS — I83813 Varicose veins of bilateral lower extremities with pain: Secondary | ICD-10-CM

## 2021-07-11 NOTE — Progress Notes (Signed)
Varicose veins of left lower extremity with inflammation (454.1  I83.10) Current Plans   Indication: Patient presents with symptomatic varicose veins of the left lower extremity.   Procedure: Sclerotherapy using hypertonic saline mixed with 1% Lidocaine was performed on the left lower extremity. Compression wraps were placed. The patient tolerated the procedure well. 

## 2021-07-16 ENCOUNTER — Telehealth: Payer: Self-pay

## 2021-07-16 NOTE — Telephone Encounter (Signed)
-----   Message from Lavera Guise, MD sent at 07/16/2021 11:46 AM EST ----- BMD normal

## 2021-07-16 NOTE — Progress Notes (Signed)
BMD normal

## 2021-07-16 NOTE — Telephone Encounter (Signed)
Called and informed pt that BMD was normal per [facility]

## 2021-07-17 ENCOUNTER — Other Ambulatory Visit: Payer: Self-pay

## 2021-07-17 ENCOUNTER — Encounter: Payer: Self-pay | Admitting: Internal Medicine

## 2021-07-17 ENCOUNTER — Ambulatory Visit: Payer: BC Managed Care – PPO | Admitting: Internal Medicine

## 2021-07-17 DIAGNOSIS — Z6827 Body mass index (BMI) 27.0-27.9, adult: Secondary | ICD-10-CM | POA: Diagnosis not present

## 2021-07-17 DIAGNOSIS — G43809 Other migraine, not intractable, without status migrainosus: Secondary | ICD-10-CM

## 2021-07-17 DIAGNOSIS — D869 Sarcoidosis, unspecified: Secondary | ICD-10-CM

## 2021-07-17 DIAGNOSIS — I83813 Varicose veins of bilateral lower extremities with pain: Secondary | ICD-10-CM

## 2021-07-17 DIAGNOSIS — Z6829 Body mass index (BMI) 29.0-29.9, adult: Secondary | ICD-10-CM

## 2021-07-17 MED ORDER — PHENTERMINE HCL 37.5 MG PO CAPS: 37.5000 mg | ORAL_CAPSULE | ORAL | 0 refills | Status: DC

## 2021-07-17 MED ORDER — TOPIRAMATE 25 MG PO TABS: ORAL_TABLET | ORAL | 1 refills | Status: DC

## 2021-07-17 NOTE — Progress Notes (Signed)
[facility] [place], [place] [address]  Internal MEDICINE  Office Visit Note  Patient Name: Susan Griffith  [id number]  [id number]  Date of Service: 08/12/2021  Chief Complaint  Patient presents with   Follow-up    HPI  Patient is here for routine follow-up, she is complaining of pain in her lower extremities including cramping.  She is looking into seeing a vascular specialist for possible varicose veins correction -Headaches are well controlled on Topamax. -She is doing really well with phentermine weight loss, has lost over 20 pounds. - Patient has been regular with moderate intensity exercise plus resistance training. -She is watching her calories as well and sticking to 400-calorie diet with a good balance of protein carbohydrate and healthy fats   Current Medication: Outpatient Encounter Medications as of 07/17/2021  Medication Sig   albuterol (VENTOLIN HFA) 108 (90 Base) MCG/ACT inhaler Inhale 2 puffs into the lungs every 6 (six) hours as needed for wheezing or shortness of breath.   hydrochlorothiazide (HYDRODIURIL) 12.5 MG tablet TAKE 1 TABLET(12.5 MG) BY MOUTH DAILY   [DISCONTINUED] phentermine 37.5 MG capsule Take 1 capsule (37.5 mg total) by mouth every morning.   [DISCONTINUED] topiramate (TOPAMAX) 25 MG tablet TAKE 1 TABLET(25 MG) BY MOUTH TWICE DAILY   phentermine 37.5 MG capsule Take 1 capsule (37.5 mg total) by mouth every morning.   topiramate (TOPAMAX) 25 MG tablet Take one tab po bid   No facility-administered encounter medications on file as of 07/17/2021.    Surgical History: Past Surgical History:  Procedure Laterality Date   child birth     natural   thigh surgery  10/22/2018    Medical History: Past Medical History:  Diagnosis Date   Migraines    Sarcoidosis    Thyroid nodule     Family History: Family History  Problem Relation Age of Onset   Hyperlipidemia Mother    Hypertension Mother     Social  History   Socioeconomic History   Marital status: Married    Spouse name: Not on file   Number of children: Not on file   Years of education: Not on file   Highest education level: Not on file  Occupational History   Not on file  Tobacco Use   Smoking status: Never   Smokeless tobacco: Never  Substance and Sexual Activity   Alcohol use: Yes    Comment: ocassionally   Drug use: Never   Sexual activity: Not on file  Other Topics Concern   Not on file  Social History Narrative   Not on file   Social Determinants of Health   Financial Resource Strain: Not on file  Food Insecurity: Not on file  Transportation Needs: Not on file  Physical Activity: Not on file  Stress: Not on file  Social Connections: Not on file  Intimate Partner Violence: Not on file      Review of Systems  Constitutional:  Negative for chills, fatigue and unexpected weight change.  HENT:  Negative for congestion, postnasal drip, rhinorrhea, sneezing and sore throat.   Eyes:  Negative for redness.  Respiratory:  Negative for cough, chest tightness and shortness of breath.   Cardiovascular:  Negative for chest pain and palpitations.  Gastrointestinal:  Negative for abdominal pain, constipation, diarrhea, nausea and vomiting.  Genitourinary:  Negative for dysuria and frequency.  Musculoskeletal:  Negative for arthralgias, back pain, joint swelling and neck pain.  Skin:  Negative for rash.  Neurological: Negative.  Negative for tremors and numbness.  Hematological:  Negative for adenopathy. Does not bruise/bleed easily.  Psychiatric/Behavioral:  Negative for behavioral problems (Depression), sleep disturbance and suicidal ideas. The patient is not nervous/anxious.    Vital Signs: BP 122/84   Pulse 78   Temp 98.6 F (37 C)   Resp 16   Ht 5' 8.5" (1.74 m)   Wt 186 lb 3.2 oz (84.5 kg)   SpO2 99%   BMI 27.90 kg/m    Physical Exam Constitutional:      Appearance: Normal appearance.  HENT:      Head: Normocephalic and atraumatic.     Nose: Nose normal.     Mouth/Throat:     Mouth: Mucous membranes are moist.     Pharynx: No posterior oropharyngeal erythema.  Eyes:     Extraocular Movements: Extraocular movements intact.     Pupils: Pupils are equal, round, and reactive to light.  Cardiovascular:     Pulses: Normal pulses.     Heart sounds: Normal heart sounds.  Pulmonary:     Effort: Pulmonary effort is normal.     Breath sounds: Normal breath sounds.  Neurological:     General: No focal deficit present.     Mental Status: She is alert.  Psychiatric:        Mood and Affect: Mood normal.        Behavior: Behavior normal.       Assessment/Plan: 1. Other migraine without status migrainosus, not intractable Will continue on Topamax as prescribed today she is doing well this is also helping with her weight loss - topiramate (TOPAMAX) 25 MG tablet; Take one tab po bid  Dispense: 184 tablet; Refill: 1  2. Varicose veins of bilateral lower extremities with pain Patient see vascular for possible correction after getting evaluation  3. Body mass index (BMI) 27.0-27.9, adult Continue to do well however she has lost over 20 pounds BMI has decreased from 29-27 we will continue phentermine - phentermine 37.5 MG capsule; Take 1 capsule (37.5 mg total) by mouth every morning.  Dispense: 30 capsule; Refill: 0  4. Sarcoidosis This is stable patient does use albuterol on a as needed basis  General Counseling: Jackelin verbalizes understanding of the findings of todays visit and agrees with plan of treatment. I have discussed any further diagnostic evaluation that may be needed or ordered today. We also reviewed her medications today. she has been encouraged to call the office with any questions or concerns that should arise related to todays visit.    No orders of the defined types were placed in this encounter.   Meds ordered this encounter  Medications   topiramate (TOPAMAX) 25  MG tablet    Sig: Take one tab po bid    Dispense:  184 tablet    Refill:  1    **Patient requests 90 days supply**   phentermine 37.5 MG capsule    Sig: Take 1 capsule (37.5 mg total) by mouth every morning.    Dispense:  30 capsule    Refill:  0    Total time spent:35 Minutes Time spent includes review of chart, medications, test results, and follow up plan with the patient.   [place] Controlled Substance Database was reviewed by me.   Dr Lavera Guise Internal medicine

## 2021-07-30 ENCOUNTER — Ambulatory Visit: Payer: Self-pay | Admitting: Internal Medicine

## 2021-07-30 ENCOUNTER — Other Ambulatory Visit: Payer: Self-pay

## 2021-07-30 DIAGNOSIS — Z411 Encounter for cosmetic surgery: Secondary | ICD-10-CM

## 2021-08-08 ENCOUNTER — Other Ambulatory Visit: Payer: Self-pay

## 2021-08-08 ENCOUNTER — Ambulatory Visit (INDEPENDENT_AMBULATORY_CARE_PROVIDER_SITE_OTHER): Payer: BC Managed Care – PPO | Admitting: Vascular Surgery

## 2021-08-08 ENCOUNTER — Encounter (INDEPENDENT_AMBULATORY_CARE_PROVIDER_SITE_OTHER): Payer: Self-pay | Admitting: Vascular Surgery

## 2021-08-08 VITALS — BP 134/69 | HR 84 | Resp 16 | Wt 184.4 lb

## 2021-08-08 DIAGNOSIS — I83813 Varicose veins of bilateral lower extremities with pain: Secondary | ICD-10-CM

## 2021-08-08 NOTE — Progress Notes (Signed)
Varicose veins of bilateral  lower extremity with inflammation (454.1  I83.10) Current Plans   Indication: Patient presents with symptomatic varicose veins of the bilateral  lower extremity.   Procedure: Sclerotherapy using hypertonic saline mixed with 1% Lidocaine was performed on the bilateral lower extremity. Compression wraps were placed. The patient tolerated the procedure well. 

## 2021-08-29 ENCOUNTER — Ambulatory Visit (INDEPENDENT_AMBULATORY_CARE_PROVIDER_SITE_OTHER): Payer: BC Managed Care – PPO | Admitting: Nurse Practitioner

## 2021-08-29 ENCOUNTER — Other Ambulatory Visit: Payer: Self-pay

## 2021-08-29 VITALS — BP 126/83 | HR 81 | Ht 69.0 in | Wt 185.0 lb

## 2021-08-29 DIAGNOSIS — I83813 Varicose veins of bilateral lower extremities with pain: Secondary | ICD-10-CM | POA: Diagnosis not present

## 2021-09-10 ENCOUNTER — Encounter (INDEPENDENT_AMBULATORY_CARE_PROVIDER_SITE_OTHER): Payer: Self-pay | Admitting: Nurse Practitioner

## 2021-09-10 NOTE — Progress Notes (Addendum)
Varicose veins of bilateral lower extremity with inflammation (454.1   I83.10) Current Plans   Indication: Patient presents with symptomatic varicose veins of the bilateral lower extremity.   Procedure: Sclerotherapy using hypertonic saline mixed with 1% Lidocaine was performed on the bilateral lower extremity. Compression wraps were placed. The patient tolerated the procedure well.   Based on the size of some of the patient's varicosities, foam schlerotherapy would be better to treat the continued pain discomfort that she has in these.  We have discussed risk benefits alternatives and the patient agrees to proceed.

## 2021-09-18 ENCOUNTER — Encounter: Payer: Self-pay | Admitting: Nurse Practitioner

## 2021-09-18 ENCOUNTER — Other Ambulatory Visit: Payer: Self-pay

## 2021-09-18 ENCOUNTER — Ambulatory Visit: Payer: BC Managed Care – PPO | Admitting: Nurse Practitioner

## 2021-09-18 VITALS — BP 126/81 | HR 76 | Temp 98.0°F | Resp 16 | Ht 68.5 in | Wt 184.2 lb

## 2021-09-18 DIAGNOSIS — Z6828 Body mass index (BMI) 28.0-28.9, adult: Secondary | ICD-10-CM

## 2021-09-18 DIAGNOSIS — Z6827 Body mass index (BMI) 27.0-27.9, adult: Secondary | ICD-10-CM | POA: Diagnosis not present

## 2021-09-18 DIAGNOSIS — G43809 Other migraine, not intractable, without status migrainosus: Secondary | ICD-10-CM | POA: Diagnosis not present

## 2021-09-18 DIAGNOSIS — D229 Melanocytic nevi, unspecified: Secondary | ICD-10-CM

## 2021-09-18 MED ORDER — PHENTERMINE HCL 37.5 MG PO CAPS: 37.5000 mg | ORAL_CAPSULE | ORAL | 0 refills | Status: DC

## 2021-09-18 NOTE — Progress Notes (Signed)
[facility] [place], [place] [address]  Internal MEDICINE  Office Visit Note  Patient Name: Susan Griffith  [id number]  [id number]  Date of Service: 09/18/2021  Chief Complaint  Patient presents with   Follow-up   Medical Management of Chronic Issues    Weight management    Hypothyroidism    HPI Susan Griffith presents for a follow up visit for weight loss management. She continues to lose weight on phentermine. She has lost 2 lbs since her last visit according to the scale in the clinic. She weighed 182 lbs on her scale at home this morning so she has lost a total of 5 lbs.     Current Medication: Outpatient Encounter Medications as of 09/18/2021  Medication Sig   albuterol (VENTOLIN HFA) 108 (90 Base) MCG/ACT inhaler Inhale 2 puffs into the lungs every 6 (six) hours as needed for wheezing or shortness of breath.   [DISCONTINUED] hydrochlorothiazide (HYDRODIURIL) 12.5 MG tablet TAKE 1 TABLET(12.5 MG) BY MOUTH DAILY   [DISCONTINUED] phentermine 37.5 MG capsule Take 1 capsule (37.5 mg total) by mouth every morning.   [DISCONTINUED] topiramate (TOPAMAX) 25 MG tablet Take one tab po bid   [DISCONTINUED] phentermine 37.5 MG capsule Take 1 capsule (37.5 mg total) by mouth every morning.   No facility-administered encounter medications on file as of 09/18/2021.    Surgical History: Past Surgical History:  Procedure Laterality Date   child birth     natural   thigh surgery  10/22/2018    Medical History: Past Medical History:  Diagnosis Date   Migraines    Sarcoidosis    Thyroid nodule     Family History: Family History  Problem Relation Age of Onset   Hyperlipidemia Mother    Hypertension Mother     Social History   Socioeconomic History   Marital status: Married    Spouse name: Not on file   Number of children: Not on file   Years of education: Not on file   Highest education level: Not on file  Occupational History   Not on file   Tobacco Use   Smoking status: Never   Smokeless tobacco: Never  Substance and Sexual Activity   Alcohol use: Yes    Comment: ocassionally   Drug use: Never   Sexual activity: Not on file  Other Topics Concern   Not on file  Social History Narrative   Not on file   Social Determinants of Health   Financial Resource Strain: Not on file  Food Insecurity: Not on file  Transportation Needs: Not on file  Physical Activity: Not on file  Stress: Not on file  Social Connections: Not on file  Intimate Partner Violence: Not on file      Review of Systems  Constitutional:  Negative for chills, fatigue and unexpected weight change.  HENT:  Negative for congestion, rhinorrhea, sneezing and sore throat.   Eyes:  Negative for redness.  Respiratory:  Negative for cough, chest tightness and shortness of breath.   Cardiovascular:  Negative for chest pain and palpitations.  Gastrointestinal:  Negative for abdominal pain, constipation, diarrhea, nausea and vomiting.  Genitourinary:  Negative for dysuria and frequency.  Musculoskeletal:  Negative for arthralgias, back pain, joint swelling and neck pain.  Skin:  Negative for rash.  Neurological: Negative.  Negative for tremors and numbness.  Hematological:  Negative for adenopathy. Does not bruise/bleed easily.  Psychiatric/Behavioral:  Negative for behavioral problems (Depression), sleep disturbance and suicidal ideas. The  patient is not nervous/anxious.    Vital Signs: BP 126/81    Pulse 76    Temp 98 F (36.7 C)    Resp 16    Ht 5' 8.5" (1.74 m)    Wt 184 lb 3.2 oz (83.6 kg)    SpO2 97%    BMI 27.60 kg/m    Physical Exam Vitals reviewed.  Constitutional:      General: She is not in acute distress.    Appearance: She is well-developed. She is not diaphoretic.  HENT:     Head: Normocephalic and atraumatic.     Mouth/Throat:     Pharynx: No oropharyngeal exudate.  Eyes:     Pupils: Pupils are equal, round, and reactive to light.   Neck:     Thyroid: No thyromegaly.     Vascular: No JVD.     Trachea: No tracheal deviation.  Cardiovascular:     Rate and Rhythm: Normal rate and regular rhythm.     Heart sounds: No murmur heard.   No friction rub. No gallop.  Pulmonary:     Effort: Pulmonary effort is normal. No respiratory distress.  Abdominal:     General: Bowel sounds are normal.     Palpations: Abdomen is soft.  Musculoskeletal:        General: Normal range of motion.     Cervical back: Normal range of motion and neck supple.  Lymphadenopathy:     Cervical: No cervical adenopathy.  Skin:    General: Skin is warm and dry.  Neurological:     Mental Status: She is alert and oriented to person, place, and time.     Cranial Nerves: No cranial nerve deficit.  Psychiatric:        Behavior: Behavior normal.        Thought Content: Thought content normal.        Judgment: Judgment normal.       Assessment/Plan: 1. Other migraine without status migrainosus, not intractable Manageable with OTC medications.   2. Atypical mole Patient has a mole she wants removed. Refer to dermatology.  - Ambulatory referral to Dermatology  3. Body mass index (BMI) 27.0-27.9, adult Lost 5 more lbs, continue phentermine, follow up in 1 month.  -   General Counseling: [name] verbalizes understanding of the findings of todays visit and agrees with plan of treatment. I have discussed any further diagnostic evaluation that may be needed or ordered today. We also reviewed her medications today. she has been encouraged to call the office with any questions or concerns that should arise related to todays visit.    Orders Placed This Encounter  Procedures   Ambulatory referral to Dermatology    Meds ordered this encounter  Medications   DISCONTD: phentermine 37.5 MG capsule    Sig: Take 1 capsule (37.5 mg total) by mouth every morning.    Dispense:  30 capsule    Refill:  0    Return in about 1 month (around 10/19/2021)  for F/U, Weight loss, Susan Griffith PCP.   Total time spent:30 Minutes Time spent includes review of chart, medications, test results, and follow up plan with the patient.   [place] Controlled Substance Database was reviewed by me.  This patient was seen by Susan Osgood, FNP-C in collaboration with Dr. Clayborn Bigness as a part of collaborative care agreement.   Yitzel Shasteen R. Valetta Fuller, MSN, FNP-C Internal medicine

## 2021-09-26 ENCOUNTER — Ambulatory Visit (INDEPENDENT_AMBULATORY_CARE_PROVIDER_SITE_OTHER): Payer: BC Managed Care – PPO | Admitting: Vascular Surgery

## 2021-10-08 ENCOUNTER — Other Ambulatory Visit: Payer: Self-pay

## 2021-10-08 ENCOUNTER — Encounter: Payer: Self-pay | Admitting: Dermatology

## 2021-10-08 ENCOUNTER — Ambulatory Visit: Payer: BC Managed Care – PPO | Admitting: Dermatology

## 2021-10-08 DIAGNOSIS — L578 Other skin changes due to chronic exposure to nonionizing radiation: Secondary | ICD-10-CM | POA: Diagnosis not present

## 2021-10-08 DIAGNOSIS — D2239 Melanocytic nevi of other parts of face: Secondary | ICD-10-CM

## 2021-10-08 DIAGNOSIS — D229 Melanocytic nevi, unspecified: Secondary | ICD-10-CM

## 2021-10-08 DIAGNOSIS — D224 Melanocytic nevi of scalp and neck: Secondary | ICD-10-CM | POA: Diagnosis not present

## 2021-10-08 DIAGNOSIS — L821 Other seborrheic keratosis: Secondary | ICD-10-CM

## 2021-10-08 DIAGNOSIS — L905 Scar conditions and fibrosis of skin: Secondary | ICD-10-CM | POA: Diagnosis not present

## 2021-10-08 DIAGNOSIS — D492 Neoplasm of unspecified behavior of bone, soft tissue, and skin: Secondary | ICD-10-CM

## 2021-10-08 NOTE — Patient Instructions (Addendum)
Wound Care Instructions  Cleanse wound gently with soap and water once a day then pat dry with clean gauze. Apply a thing coat of Petrolatum (petroleum jelly, "Vaseline") over the wound (unless you have an allergy to this). We recommend that you use a new, sterile tube of Vaseline. Do not pick or remove scabs. Do not remove the yellow or white "healing tissue" from the base of the wound.  Cover the wound with fresh, clean, nonstick gauze and secure with paper tape. You may use Band-Aids in place of gauze and tape if the would is small enough, but would recommend trimming much of the tape off as there is often too much. Sometimes Band-Aids can irritate the skin.  You should call the office for your biopsy report after 1 week if you have not already been contacted.  If you experience any problems, such as abnormal amounts of bleeding, swelling, significant bruising, significant pain, or evidence of infection, please call the office immediately.  FOR ADULT SURGERY PATIENTS: If you need something for pain relief you may take 1 extra strength Tylenol (acetaminophen) AND 2 Ibuprofen (200mg  each) together every 4 hours as needed for pain. (do not take these if you are allergic to them or if you have a reason you should not take them.) Typically, you may only need pain medication for 1 to 3 days.   Recommend daily broad spectrum sunscreen SPF 30+ to sun-exposed areas, reapply every 2 hours as needed. Call for new or changing lesions.  Staying in the shade or wearing long sleeves, sun glasses (UVA+UVB protection) and wide brim hats (4-inch brim around the entire circumference of the hat) are also recommended for sun protection.     If You Need Anything After Your Visit  If you have any questions or concerns for your doctor, please call our main line at [phone] and press option 4 to reach your doctor's medical assistant. If no one answers, please leave a voicemail as directed and we will return your  call as soon as possible. Messages left after 4 pm will be answered the following business day.   You may also send [place] a message via [place]. We typically respond to MyChart messages within 1-2 business days.  For prescription refills, please ask your pharmacy to contact our office. Our fax number is [phone].  If you have an urgent issue when the clinic is closed that cannot wait until the next business day, you can page your doctor at the number below.    Please note that while we do our best to be available for urgent issues outside of office hours, we are not available 24/7.   If you have an urgent issue and are unable to reach [place], you may choose to seek medical care at your doctor's office, retail clinic, urgent care center, or emergency room.  If you have a medical emergency, please immediately call 911 or go to the emergency department.  Pager Numbers  - Dr. Nehemiah Massed: [phone]  - Dr. Laurence Ferrari: [phone]  - Dr. Nicole Kindred: [phone]  In the event of inclement weather, please call our main line at [phone] for an update on the status of any delays or closures.  Dermatology Medication Tips: Please keep the boxes that topical medications come in in order to help keep track of the instructions about where and how to use these. Pharmacies typically print the medication instructions only on the boxes and not directly on the medication tubes.   If your medication is  too expensive, please contact our office at [phone] option 4 or send [place] a message through [place].   We are unable to tell what your co-pay for medications will be in advance as this is different depending on your insurance coverage. However, we may be able to find a substitute medication at lower cost or fill out paperwork to get insurance to cover a needed medication.   If a prior authorization is required to get your medication covered by your insurance company, please allow [place] 1-2 business days to  complete this process.  Drug prices often vary depending on where the prescription is filled and some pharmacies may offer cheaper prices.  The website www.goodrx.com contains coupons for medications through different pharmacies. The prices here do not account for what the cost may be with help from insurance (it may be cheaper with your insurance), but the website can give you the price if you did not use any insurance.  - You can print the associated coupon and take it with your prescription to the pharmacy.  - You may also stop by our office during regular business hours and pick up a GoodRx coupon card.  - If you need your prescription sent electronically to a different pharmacy, notify our office through [facility] or by phone at [phone] option 4.     Si Usted Necesita Algo Despus de Su Visita  Tambin puede enviarnos un mensaje a travs de Pharmacist, community. Por lo general respondemos a los mensajes de MyChart en el transcurso de 1 a 2 das hbiles.  Para renovar recetas, por favor pida a su farmacia que se ponga en contacto con nuestra oficina. Harland Dingwall de fax es [place] [phone].  Si tiene un asunto urgente cuando la clnica est cerrada y que no puede esperar hasta el siguiente da hbil, puede llamar/localizar a su doctor(a) al nmero que aparece a continuacin.   Por favor, tenga en cuenta que aunque hacemos todo lo posible para estar disponibles para asuntos urgentes fuera del horario de [place], no estamos disponibles las 24 horas del da, los 7 das de la [place].   Si tiene un problema urgente y no puede comunicarse con nosotros, puede optar por buscar atencin mdica  en el consultorio de su doctor(a), en una clnica privada, en un centro de atencin urgente o en una sala de emergencias.  Si tiene Engineering geologist, por favor llame inmediatamente al 911 o vaya a la sala de emergencias.  Nmeros de bper  - Dr. Nehemiah Massed: [phone]  - Dra. Moye:  [phone]  - Dra. Nicole Kindred: [phone]  En caso de inclemencias del [place], por favor llame a Johnsie Kindred principal al [phone] para una actualizacin sobre el [place] de cualquier retraso o cierre.  Consejos para la medicacin en dermatologa: Por favor, guarde las cajas en las que vienen los medicamentos de uso tpico para ayudarle a seguir las instrucciones sobre dnde y cmo usarlos. Las farmacias generalmente imprimen las instrucciones del medicamento slo en las cajas y no directamente en los tubos del [place].   Si su medicamento es muy caro, por favor, pngase en contacto con Zigmund Daniel llamando al [phone] y presione la opcin 4 o envenos un mensaje a travs de Pharmacist, community.   No podemos decirle cul ser su copago por los medicamentos por adelantado ya que esto es diferente dependiendo de la cobertura de su seguro. Sin embargo, es posible que podamos encontrar un medicamento sustituto a Electrical engineer un formulario para que el  seguro cubra el medicamento que se considera necesario.   Si se requiere una autorizacin previa para que su compaa de seguros [place] su medicamento, por favor permtanos de 1 a 2 das hbiles para completar este proceso.  Los precios de los medicamentos varan con frecuencia dependiendo del Environmental consultant de dnde se surte la receta y alguna farmacias pueden ofrecer precios ms baratos.  El sitio web www.goodrx.com tiene cupones para medicamentos de Airline pilot. Los precios aqu no tienen en cuenta lo que podra costar con la ayuda del seguro (puede ser ms barato con su seguro), pero el sitio web puede darle el precio si no utiliz Research scientist (physical sciences).  - Puede imprimir el cupn correspondiente y llevarlo con su receta a la farmacia.  - Tambin puede pasar por nuestra oficina durante el horario de atencin regular y Charity fundraiser una tarjeta de cupones de GoodRx.  - Si necesita que su receta se enve electrnicamente a una farmacia diferente,  informe a nuestra oficina a travs de MyChart de [place] o por telfono llamando al [phone] y presione la opcin 4.

## 2021-10-08 NOTE — Progress Notes (Signed)
° °  New Patient Visit  Subjective  Susan Griffith is a 56 y.o. female who presents for the following: mole (Check mole on face. Dur: couple of months. Non tender, denies itch, does not bleed. Would like other areas on face checked today as well). The patient has spots, moles and lesions to be evaluated, some may be new or changing and the patient has concerns that these could be cancer.  Review of Systems: No other skin or systemic complaints except as noted in HPI or Assessment and Plan.  Objective  Well appearing patient in no apparent distress; mood and affect are within normal limits.  A focused examination was performed including face, neck, hands. Relevant physical exam findings are noted in the Assessment and Plan.  Right Mandible 0.3 cm slightly irregular brown macule     Anterior Mid Neck, Left Parotid Area Hypopigmented papules       Left Cheek Keratotic or waxy stuck-on papule or plaque.   Assessment & Plan   Actinic Damage - chronic, secondary to cumulative UV radiation exposure/sun exposure over time - diffuse scaly erythematous macules with underlying dyspigmentation - Recommend daily broad spectrum sunscreen SPF 30+ to sun-exposed areas, reapply every 2 hours as needed.  - Recommend staying in the shade or wearing long sleeves, sun glasses (UVA+UVB protection) and wide brim hats (4-inch brim around the entire circumference of the hat). - Call for new or changing lesions.  Neoplasm of skin Right Mandible Epidermal / dermal shaving  Lesion diameter (cm):  0.3 Informed consent: discussed and consent obtained   Timeout: patient name, date of birth, surgical site, and procedure verified   Procedure prep:  Patient was prepped and draped in usual sterile fashion Prep type:  Isopropyl alcohol Anesthesia: the lesion was anesthetized in a standard fashion   Anesthetic:  1% lidocaine w/ epinephrine 1-100,000 buffered w/ 8.4% NaHCO3 Instrument used:  flexible razor blade   Hemostasis achieved with: pressure, aluminum chloride and electrodesiccation   Outcome: patient tolerated procedure well   Post-procedure details: sterile dressing applied and wound care instructions given   Dressing type: bandage and petrolatum    Specimen 1 - Surgical pathology Differential Diagnosis: Nevus, R/O atypia Check Margins: No  Nevus (2) Left Parotid Area; Anterior Mid Neck See photos Senescent Nevus Benign-appearing.  Observation.  Call clinic for new or changing lesions.  Recommend daily use of broad spectrum spf 30+ sunscreen to sun-exposed areas.    Discussed surgical removal and risk of scarring. Watch areas for now.  Seborrheic keratosis Left Cheek Benign, observe.    Return for TBSE next availabe.  I, Emelia Salisbury, CMA, am acting as scribe for Sarina Ser, MD. Documentation: I have reviewed the above documentation for accuracy and completeness, and I agree with the above.  Sarina Ser, MD

## 2021-10-09 ENCOUNTER — Encounter: Payer: Self-pay | Admitting: Dermatology

## 2021-10-09 ENCOUNTER — Telehealth: Payer: Self-pay

## 2021-10-09 NOTE — Telephone Encounter (Signed)
-----   Message from Ralene Bathe, MD sent at 10/09/2021  3:08 PM EST ----- Diagnosis Skin , right mandible MELANOCYTIC NEVUS, COMPOUND TYPE, WITH SCAR, BASE INVOLVED, SEE DESCRIPTION  Benign mole with scar No further treatment needed

## 2021-10-09 NOTE — Telephone Encounter (Signed)
Advised pt of bx results/sh ?

## 2021-10-15 ENCOUNTER — Other Ambulatory Visit: Payer: Self-pay | Admitting: Nurse Practitioner

## 2021-10-15 DIAGNOSIS — R030 Elevated blood-pressure reading, without diagnosis of hypertension: Secondary | ICD-10-CM

## 2021-10-16 ENCOUNTER — Encounter: Payer: Self-pay | Admitting: Internal Medicine

## 2021-10-16 ENCOUNTER — Ambulatory Visit: Payer: BC Managed Care – PPO | Admitting: Internal Medicine

## 2021-10-16 ENCOUNTER — Other Ambulatory Visit: Payer: Self-pay

## 2021-10-16 DIAGNOSIS — G43809 Other migraine, not intractable, without status migrainosus: Secondary | ICD-10-CM | POA: Diagnosis not present

## 2021-10-16 DIAGNOSIS — Z6827 Body mass index (BMI) 27.0-27.9, adult: Secondary | ICD-10-CM | POA: Diagnosis not present

## 2021-10-16 DIAGNOSIS — I83813 Varicose veins of bilateral lower extremities with pain: Secondary | ICD-10-CM | POA: Diagnosis not present

## 2021-10-16 MED ORDER — TOPIRAMATE 25 MG PO TABS: ORAL_TABLET | ORAL | 1 refills | Status: DC

## 2021-10-16 MED ORDER — PHENTERMINE HCL 37.5 MG PO CAPS: 37.5000 mg | ORAL_CAPSULE | ORAL | 0 refills | Status: DC

## 2021-10-16 NOTE — Progress Notes (Signed)
[facility] [place], [place] [address]  Internal MEDICINE  Office Visit Note  Patient Name: Susan Griffith  [id number]  [id number]  Date of Service: 10/22/2021  Chief Complaint  Patient presents with   Follow-up    HPI Pt is here for routine follow up   -Headaches are well controlled on Topamax. -She is doing really well with phentermine weight loss, has lost over 20 pounds. - Patient has been regular with moderate intensity exercise plus resistance training. -She is watching her calories as well and sticking to 400-calorie diet with a good balance of protein carbohydrate and healthy fats  - Seen by vascular for varicose veins  Current Medication: Outpatient Encounter Medications as of 10/16/2021  Medication Sig   albuterol (VENTOLIN HFA) 108 (90 Base) MCG/ACT inhaler Inhale 2 puffs into the lungs every 6 (six) hours as needed for wheezing or shortness of breath.   hydrochlorothiazide (HYDRODIURIL) 12.5 MG tablet TAKE 1 TABLET(12.5 MG) BY MOUTH DAILY   [DISCONTINUED] phentermine 37.5 MG capsule Take 1 capsule (37.5 mg total) by mouth every morning.   [DISCONTINUED] topiramate (TOPAMAX) 25 MG tablet Take one tab po bid   phentermine 37.5 MG capsule Take 1 capsule (37.5 mg total) by mouth every morning.   topiramate (TOPAMAX) 25 MG tablet Take one tab po bid   No facility-administered encounter medications on file as of 10/16/2021.    Surgical History: Past Surgical History:  Procedure Laterality Date   child birth     natural   thigh surgery  10/22/2018    Medical History: Past Medical History:  Diagnosis Date   Migraines    Sarcoidosis    Thyroid nodule     Family History: Family History  Problem Relation Age of Onset   Hyperlipidemia Mother    Hypertension Mother     Social History   Socioeconomic History   Marital status: Married    Spouse name: Not on file   Number of children: Not on file   Years of education: Not  on file   Highest education level: Not on file  Occupational History   Not on file  Tobacco Use   Smoking status: Never   Smokeless tobacco: Never  Substance and Sexual Activity   Alcohol use: Yes    Comment: ocassionally   Drug use: Never   Sexual activity: Not on file  Other Topics Concern   Not on file  Social History Narrative   Not on file   Social Determinants of Health   Financial Resource Strain: Not on file  Food Insecurity: Not on file  Transportation Needs: Not on file  Physical Activity: Not on file  Stress: Not on file  Social Connections: Not on file  Intimate Partner Violence: Not on file      Review of Systems  Constitutional:  Negative for fatigue and fever.  HENT:  Negative for congestion, mouth sores and postnasal drip.   Respiratory:  Negative for cough.   Cardiovascular:  Negative for chest pain.       Leg pain, VV  Genitourinary:  Negative for flank pain.  Psychiatric/Behavioral: Negative.     Vital Signs: BP 118/84    Pulse 79    Temp 98.6 F (37 C)    Resp 16    Ht 5' 8.5" (1.74 m)    Wt 185 lb 9.6 oz (84.2 kg)    SpO2 99%    BMI 27.81 kg/m    Physical Exam Constitutional:  Appearance: Normal appearance.  HENT:     Head: Normocephalic and atraumatic.     Nose: Nose normal.     Mouth/Throat:     Mouth: Mucous membranes are moist.     Pharynx: No posterior oropharyngeal erythema.  Eyes:     Extraocular Movements: Extraocular movements intact.     Pupils: Pupils are equal, round, and reactive to light.  Cardiovascular:     Pulses: Normal pulses.     Heart sounds: Normal heart sounds.  Pulmonary:     Effort: Pulmonary effort is normal.     Breath sounds: Normal breath sounds.  Neurological:     General: No focal deficit present.     Mental Status: She is alert.  Psychiatric:        Mood and Affect: Mood normal.        Behavior: Behavior normal.       Assessment/Plan: 1. Other migraine without status migrainosus, not  intractable Controlled, no acute headaches, also helps with appetite control  - topiramate (TOPAMAX) 25 MG tablet; Take one tab po bid  Dispense: 184 tablet; Refill: 1  2. Body mass index (BMI) 27.0-27.9, adult Pt has done well with wt loss  - phentermine 37.5 MG capsule; Take 1 capsule (37.5 mg total) by mouth every morning.  Dispense: 30 capsule; Refill: 0  3. Varicose veins of bilateral lower extremities with pain Continue with vascular    General Counseling: Susan Griffith verbalizes understanding of the findings of todays visit and agrees with plan of treatment. I have discussed any further diagnostic evaluation that may be needed or ordered today. We also reviewed her medications today. she has been encouraged to call the office with any questions or concerns that should arise related to todays visit.    No orders of the defined types were placed in this encounter.   Meds ordered this encounter  Medications   topiramate (TOPAMAX) 25 MG tablet    Sig: Take one tab po bid    Dispense:  184 tablet    Refill:  1    **Patient requests 90 days supply**   phentermine 37.5 MG capsule    Sig: Take 1 capsule (37.5 mg total) by mouth every morning.    Dispense:  30 capsule    Refill:  0    Total time spent:30 Minutes Time spent includes review of chart, medications, test results, and follow up plan with the patient.   [place] Controlled Substance Database was reviewed by me.   Dr Lavera Guise Internal medicine

## 2021-10-16 NOTE — Progress Notes (Signed)
Pt is seen for cosmetic procedure  Consent for Botox is obtained. Area of concern is clean and treated with 30 units of Botox in the glabellar and the frontalis region she tolerated the procedure well instruction for aftercare is also given which this patient is not to do any heavy lifting or exercise and also lay down for the next 4 hours

## 2021-10-24 ENCOUNTER — Telehealth (INDEPENDENT_AMBULATORY_CARE_PROVIDER_SITE_OTHER): Payer: Self-pay | Admitting: *Deleted

## 2021-10-24 ENCOUNTER — Encounter: Payer: Self-pay | Admitting: Nurse Practitioner

## 2021-10-24 ENCOUNTER — Ambulatory Visit (INDEPENDENT_AMBULATORY_CARE_PROVIDER_SITE_OTHER): Payer: BC Managed Care – PPO | Admitting: Vascular Surgery

## 2021-10-24 NOTE — Telephone Encounter (Signed)
Patient called about a bill she received from visit 08/29/21. She states that she paid in full for her treatments back in September of 2022 and should not have a balance.She states that Lavella Lemons told her she should only have a copay and that the $800 she paid that day was for her balance for the entire process.(Over multiple visits). Explained to patient that each visit is usually billed at time of service and without her EOB's not sure what was paid for and that Copays usually do not apply when procedures are in play. She is adiminat that she sholud not have a bill. I asked her to contact her insurance company about the dates of services from 05/2021 until 12/22 and to get copies of her EOB's as a first step. I will track down her receipt from September and they we will go from there. Next step is for her to contact billing to find out what was billed and then maybe we will have some answers.   She agreed to do that and we will talk in a few days.

## 2021-10-30 ENCOUNTER — Other Ambulatory Visit: Payer: Self-pay

## 2021-10-30 ENCOUNTER — Ambulatory Visit (INDEPENDENT_AMBULATORY_CARE_PROVIDER_SITE_OTHER): Payer: BC Managed Care – PPO | Admitting: Vascular Surgery

## 2021-11-13 ENCOUNTER — Encounter: Payer: Self-pay | Admitting: Nurse Practitioner

## 2021-11-13 ENCOUNTER — Ambulatory Visit: Payer: BC Managed Care – PPO | Admitting: Nurse Practitioner

## 2021-11-13 ENCOUNTER — Other Ambulatory Visit: Payer: Self-pay

## 2021-11-13 VITALS — BP 126/78 | HR 82 | Temp 98.8°F | Resp 16 | Ht 69.0 in | Wt 185.6 lb

## 2021-11-13 DIAGNOSIS — Z6827 Body mass index (BMI) 27.0-27.9, adult: Secondary | ICD-10-CM

## 2021-11-13 DIAGNOSIS — I10 Essential (primary) hypertension: Secondary | ICD-10-CM | POA: Diagnosis not present

## 2021-11-13 DIAGNOSIS — G43809 Other migraine, not intractable, without status migrainosus: Secondary | ICD-10-CM | POA: Diagnosis not present

## 2021-11-13 DIAGNOSIS — D229 Melanocytic nevi, unspecified: Secondary | ICD-10-CM

## 2021-11-13 MED ORDER — SEMAGLUTIDE-WEIGHT MANAGEMENT 0.5 MG/0.5ML ~~LOC~~ SOAJ: 0.5000 mg | SUBCUTANEOUS | 1 refills | Status: DC

## 2021-11-13 NOTE — Progress Notes (Signed)
[facility] ?[address] ?[place], [place] [address] ? ?Internal MEDICINE  ?Office Visit Note ? ?Patient Name: Susan Griffith ? [id number]  ?[id number] ? ?Date of Service: 11/13/2021 ? ?Chief Complaint  ?Patient presents with  ? Follow-up  ? Medication Refill  ? ? ?HPI ?Susan Griffith presents for a follow-up visit for medication refills and weight loss management.  She has been using phentermine but is interested in trying [facility].  At her previous office visit she had a mole on the right side of her face that she wanted to have removed.  She was referred to dermatology and has since been to see them.  She had the mole removed and it was found to be benign. ? ? ? ?Current Medication: ?Outpatient Encounter Medications as of 11/13/2021  ?Medication Sig  ? albuterol (VENTOLIN HFA) 108 (90 Base) MCG/ACT inhaler Inhale 2 puffs into the lungs every 6 (six) hours as needed for wheezing or shortness of breath.  ? hydrochlorothiazide (HYDRODIURIL) 12.5 MG tablet TAKE 1 TABLET(12.5 MG) BY MOUTH DAILY  ? Semaglutide-Weight Management 0.5 MG/0.5ML SOAJ Inject 0.5 mg into the skin once a week.  ? topiramate (TOPAMAX) 25 MG tablet Take one tab po bid  ? [DISCONTINUED] phentermine 37.5 MG capsule Take 1 capsule (37.5 mg total) by mouth every morning.  ? ?No facility-administered encounter medications on file as of 11/13/2021.  ? ? ?Surgical History: ?Past Surgical History:  ?Procedure Laterality Date  ? child birth    ? natural  ? thigh surgery  10/22/2018  ? ? ?Medical History: ?Past Medical History:  ?Diagnosis Date  ? Migraines   ? Sarcoidosis   ? Thyroid nodule   ? ? ?Family History: ?Family History  ?Problem Relation Age of Onset  ? Hyperlipidemia Mother   ? Hypertension Mother   ? ? ?Social History  ? ?Socioeconomic History  ? Marital status: Married  ?  Spouse name: Not on file  ? Number of children: Not on file  ? Years of education: Not on file  ? Highest education level: Not on file  ?Occupational History  ? Not  on file  ?Tobacco Use  ? Smoking status: Never  ? Smokeless tobacco: Never  ?Substance and Sexual Activity  ? Alcohol use: Yes  ?  Comment: ocassionally  ? Drug use: Never  ? Sexual activity: Not on file  ?Other Topics Concern  ? Not on file  ?Social History Narrative  ? Not on file  ? ?Social Determinants of Health  ? ?Financial Resource Strain: Not on file  ?Food Insecurity: Not on file  ?Transportation Needs: Not on file  ?Physical Activity: Not on file  ?Stress: Not on file  ?Social Connections: Not on file  ?Intimate Partner Violence: Not on file  ? ? ? ? ?Review of Systems  ?Constitutional:  Negative for chills, fatigue and unexpected weight change.  ?HENT:  Negative for congestion, rhinorrhea, sneezing and sore throat.   ?Eyes:  Negative for redness.  ?Respiratory:  Negative for cough, chest tightness and shortness of breath.   ?Cardiovascular:  Negative for chest pain and palpitations.  ?Gastrointestinal:  Negative for abdominal pain, constipation, diarrhea, nausea and vomiting.  ?Genitourinary:  Negative for dysuria and frequency.  ?Musculoskeletal:  Negative for arthralgias, back pain, joint swelling and neck pain.  ?Skin:  Negative for rash.  ?Neurological: Negative.  Negative for tremors and numbness.  ?Hematological:  Negative for adenopathy. Does not bruise/bleed easily.  ?Psychiatric/Behavioral:  Negative for behavioral problems (Depression), sleep disturbance  and suicidal ideas. The patient is not nervous/anxious.   ? ?Vital Signs: ?BP 126/78   Pulse 82   Temp 98.8 ?F (37.1 ?C)   Resp 16   Ht '5\' 9"'$  (1.753 m)   Wt 185 lb 9.6 oz (84.2 kg)   SpO2 98%   BMI 27.41 kg/m?  ? ? ?Physical Exam ?Vitals reviewed.  ?Constitutional:   ?   General: She is not in acute distress. ?   Appearance: Normal appearance. She is not ill-appearing.  ?HENT:  ?   Head: Normocephalic and atraumatic.  ?Eyes:  ?   Pupils: Pupils are equal, round, and reactive to light.  ?Cardiovascular:  ?   Rate and Rhythm: Normal rate  and regular rhythm.  ?Pulmonary:  ?   Effort: Pulmonary effort is normal. No respiratory distress.  ?Neurological:  ?   Mental Status: She is alert and oriented to person, place, and time.  ?Psychiatric:     ?   Mood and Affect: Mood normal.     ?   Behavior: Behavior normal.  ? ? ? ? ? ?Assessment/Plan: ?1. Other migraine without status migrainosus, not intractable ?Takes topiramate as a preventative for migraines ? ?2. Benign hypertension ?Stable, patient takes hydrochlorothiazide daily ? ?3. Atypical mole ?Seen by dermatology, mole removed, it was benign ? ?4. Body mass index (BMI) 27.0-27.9, adult ?Phentermine discontinued.  Patient provided 4-week sample of Wegovy 0.25 mg.  Prescription for the 0.5 mg dose of Wegovy sent to her pharmacy ?- Semaglutide-Weight Management 0.5 MG/0.5ML SOAJ; Inject 0.5 mg into the skin once a week.  Dispense: 2 mL; Refill: 1 ? ? ?General Counseling: Kaytlin verbalizes understanding of the findings of todays visit and agrees with plan of treatment. I have discussed any further diagnostic evaluation that may be needed or ordered today. We also reviewed her medications today. she has been encouraged to call the office with any questions or concerns that should arise related to todays visit. ? ? ? ?No orders of the defined types were placed in this encounter. ? ? ?Meds ordered this encounter  ?Medications  ? Semaglutide-Weight Management 0.5 MG/0.5ML SOAJ  ?  Sig: Inject 0.5 mg into the skin once a week.  ?  Dispense:  2 mL  ?  Refill:  1  ?  Patient has manufacturer coupon  ? ? ?Return for F/U, Weight loss, Fahed Morten PCP. ? ? ?Total time spent: 30 minutes ?Time spent includes review of chart, medications, test results, and follow up plan with the patient.  ? ?[place] Controlled Substance Database was reviewed by me. ? ?This patient was seen by Jonetta Osgood, FNP-C in collaboration with Dr. Clayborn Bigness as a part of collaborative care agreement. ? ? ?Seger Jani R. Valetta Fuller, MSN, FNP-C ?Internal  medicine  ?

## 2021-11-27 ENCOUNTER — Ambulatory Visit (INDEPENDENT_AMBULATORY_CARE_PROVIDER_SITE_OTHER): Payer: BC Managed Care – PPO | Admitting: Vascular Surgery

## 2021-12-18 ENCOUNTER — Ambulatory Visit: Payer: BC Managed Care – PPO | Admitting: Internal Medicine

## 2021-12-18 ENCOUNTER — Ambulatory Visit (INDEPENDENT_AMBULATORY_CARE_PROVIDER_SITE_OTHER): Payer: BC Managed Care – PPO | Admitting: Internal Medicine

## 2021-12-18 ENCOUNTER — Encounter: Payer: Self-pay | Admitting: Internal Medicine

## 2021-12-18 VITALS — BP 138/80 | HR 69 | Temp 97.7°F | Resp 16 | Ht 69.0 in | Wt 187.0 lb

## 2021-12-18 DIAGNOSIS — Z6827 Body mass index (BMI) 27.0-27.9, adult: Secondary | ICD-10-CM

## 2021-12-18 DIAGNOSIS — I10 Essential (primary) hypertension: Secondary | ICD-10-CM | POA: Diagnosis not present

## 2021-12-18 DIAGNOSIS — J301 Allergic rhinitis due to pollen: Secondary | ICD-10-CM | POA: Diagnosis not present

## 2021-12-18 DIAGNOSIS — Z91199 Patient's noncompliance with other medical treatment and regimen due to unspecified reason: Secondary | ICD-10-CM

## 2021-12-18 MED ORDER — PHENTERMINE HCL 37.5 MG PO CAPS: 37.5000 mg | ORAL_CAPSULE | ORAL | 0 refills | Status: DC

## 2021-12-18 NOTE — Progress Notes (Signed)
[facility] ?[address] ?[place], [place] [address] ? ?Internal MEDICINE  ?Office Visit Note ? ?Patient Name: Susan Griffith ? [id number]  ?[id number] ? ?Date of Service: 01/15/2022 ? ?Chief Complaint  ?Patient presents with  ? Bronchitis  ?  Allergies   ? ? ?HPI ? ? ? ? ?Current Medication: ?Outpatient Encounter Medications as of 12/18/2021  ?Medication Sig  ? albuterol (VENTOLIN HFA) 108 (90 Base) MCG/ACT inhaler Inhale 2 puffs into the lungs every 6 (six) hours as needed for wheezing or shortness of breath.  ? hydrochlorothiazide (HYDRODIURIL) 12.5 MG tablet TAKE 1 TABLET(12.5 MG) BY MOUTH DAILY  ? phentermine 37.5 MG capsule Take 1 capsule (37.5 mg total) by mouth every morning.  ? Semaglutide-Weight Management 0.5 MG/0.5ML SOAJ Inject 0.5 mg into the skin once a week.  ? topiramate (TOPAMAX) 25 MG tablet Take one tab po bid  ? ?No facility-administered encounter medications on file as of 12/18/2021.  ? ? ?Surgical History: ?Past Surgical History:  ?Procedure Laterality Date  ? child birth    ? natural  ? thigh surgery  10/22/2018  ? ? ?Medical History: ?Past Medical History:  ?Diagnosis Date  ? Migraines   ? Sarcoidosis   ? Thyroid nodule   ? ? ?Family History: ?Family History  ?Problem Relation Age of Onset  ? Hyperlipidemia Mother   ? Hypertension Mother   ? ? ?Social History  ? ?Socioeconomic History  ? Marital status: Married  ?  Spouse name: Not on file  ? Number of children: Not on file  ? Years of education: Not on file  ? Highest education level: Not on file  ?Occupational History  ? Not on file  ?Tobacco Use  ? Smoking status: Never  ? Smokeless tobacco: Never  ?Substance and Sexual Activity  ? Alcohol use: Yes  ?  Comment: ocassionally  ? Drug use: Never  ? Sexual activity: Not on file  ?Other Topics Concern  ? Not on file  ?Social History Narrative  ? Not on file  ? ?Social Determinants of Health  ? ?Financial Resource Strain: Not on file  ?Food Insecurity: Not on file   ?Transportation Needs: Not on file  ?Physical Activity: Not on file  ?Stress: Not on file  ?Social Connections: Not on file  ?Intimate Partner Violence: Not on file  ? ? ? ? ?Review of Systems  ?Constitutional:  Negative for chills, fatigue and unexpected weight change.  ?HENT:  Positive for postnasal drip. Negative for congestion, rhinorrhea, sneezing and sore throat.   ?Eyes:  Negative for redness.  ?Respiratory:  Negative for cough, chest tightness and shortness of breath.   ?Cardiovascular:  Negative for chest pain and palpitations.  ?Gastrointestinal:  Negative for abdominal pain, constipation, diarrhea, nausea and vomiting.  ?Genitourinary:  Negative for dysuria and frequency.  ?Musculoskeletal:  Negative for arthralgias, back pain, joint swelling and neck pain.  ?Skin:  Negative for rash.  ?Neurological: Negative.  Negative for tremors and numbness.  ?Hematological:  Negative for adenopathy. Does not bruise/bleed easily.  ?Psychiatric/Behavioral:  Negative for behavioral problems (Depression), sleep disturbance and suicidal ideas. The patient is not nervous/anxious.   ? ?Vital Signs: ?BP 138/80 Comment: 140/85  Pulse 69   Temp 97.7 ?F (36.5 ?C)   Resp 16   Ht '5\' 9"'$  (1.753 m)   Wt 187 lb (84.8 kg)   SpO2 96%   BMI 27.62 kg/m?  ? ? ?Physical Exam ?Constitutional:   ?   Appearance: Normal appearance.  ?  HENT:  ?   Head: Normocephalic and atraumatic.  ?   Nose: Nose normal.  ?   Mouth/Throat:  ?   Mouth: Mucous membranes are moist.  ?   Pharynx: No posterior oropharyngeal erythema.  ?Eyes:  ?   Extraocular Movements: Extraocular movements intact.  ?   Pupils: Pupils are equal, round, and reactive to light.  ?Cardiovascular:  ?   Pulses: Normal pulses.  ?   Heart sounds: Normal heart sounds.  ?Pulmonary:  ?   Effort: Pulmonary effort is normal.  ?   Breath sounds: Normal breath sounds.  ?Neurological:  ?   General: No focal deficit present.  ?   Mental Status: She is alert.  ?Psychiatric:     ?   Mood and  Affect: Mood normal.     ?   Behavior: Behavior normal.  ? ? ? ? ? ?Assessment/Plan: ?1. Benign hypertension ?Continue HCTZ 12.5 MG PO QD  ? ?2. Seasonal allergic rhinitis due to pollen ?Continue OTC Claritin  ? ?3. BMI 27.0-27.9,adult ?Continue Topamax and Phentermine  ? ?General Counseling: Susan Griffith verbalizes understanding of the findings of todays visit and agrees with plan of treatment. I have discussed any further diagnostic evaluation that may be needed or ordered today. We also reviewed her medications today. she has been encouraged to call the office with any questions or concerns that should arise related to todays visit. ? ? ? ?No orders of the defined types were placed in this encounter. ? ? ?Meds ordered this encounter  ?Medications  ? phentermine 37.5 MG capsule  ?  Sig: Take 1 capsule (37.5 mg total) by mouth every morning.  ?  Dispense:  30 capsule  ?  Refill:  0  ? ? ?Total time spent:30 Minutes ?Time spent includes review of chart, medications, test results, and follow up plan with the patient.  ? ?[place] Controlled Substance Database was reviewed by me. ? ? ?Dr Lavera Guise ?Internal medicine  ?

## 2021-12-24 ENCOUNTER — Ambulatory Visit (INDEPENDENT_AMBULATORY_CARE_PROVIDER_SITE_OTHER): Payer: BC Managed Care – PPO | Admitting: Dermatology

## 2021-12-24 DIAGNOSIS — I781 Nevus, non-neoplastic: Secondary | ICD-10-CM

## 2021-12-24 DIAGNOSIS — Z1283 Encounter for screening for malignant neoplasm of skin: Secondary | ICD-10-CM | POA: Diagnosis not present

## 2021-12-24 DIAGNOSIS — L578 Other skin changes due to chronic exposure to nonionizing radiation: Secondary | ICD-10-CM

## 2021-12-24 DIAGNOSIS — D1800 Hemangioma unspecified site: Secondary | ICD-10-CM

## 2021-12-24 DIAGNOSIS — D229 Melanocytic nevi, unspecified: Secondary | ICD-10-CM

## 2021-12-24 DIAGNOSIS — L814 Other melanin hyperpigmentation: Secondary | ICD-10-CM

## 2021-12-24 DIAGNOSIS — L821 Other seborrheic keratosis: Secondary | ICD-10-CM | POA: Diagnosis not present

## 2021-12-24 NOTE — Progress Notes (Signed)
? ?  Follow-Up Visit ?  ?Subjective  ?Susan Griffith is a 56 y.o. female who presents for the following: Annual Exam. The patient presents for Total-Body Skin Exam (TBSE) for skin cancer screening and mole check.  The patient has spots, moles and lesions to be evaluated, some may be new or changing and the patient has concerns that these could be cancer. ? ?The following portions of the chart were reviewed this encounter and updated as appropriate:  ? Tobacco  Allergies  Meds  Problems  Med Hx  Surg Hx  Fam Hx   ?  ?Review of Systems:  No other skin or systemic complaints except as noted in HPI or Assessment and Plan. ? ?Objective  ?Well appearing patient in no apparent distress; mood and affect are within normal limits. ? ?A full examination was performed including scalp, head, eyes, ears, nose, lips, neck, chest, axillae, abdomen, back, buttocks, bilateral upper extremities, bilateral lower extremities, hands, feet, fingers, toes, fingernails, and toenails. All findings within normal limits unless otherwise noted below. ? ?Legs ?Spider veins ? ?Assessment & Plan  ?Spider veins ?Legs ?Benign ? ?Lentigines ?- Scattered tan macules ?- Due to sun exposure ?- Benign-appearing, observe ?- Recommend daily broad spectrum sunscreen SPF 30+ to sun-exposed areas, reapply every 2 hours as needed. ?- Call for any changes ? ?Seborrheic Keratoses ?- Stuck-on, waxy, tan-brown papules and/or plaques  ?- Benign-appearing ?- Discussed benign etiology and prognosis. ?- Observe ?- Call for any changes ? ?Melanocytic Nevi ?- Tan-brown and/or pink-flesh-colored symmetric macules and papules ?- Benign appearing on exam today ?- Observation ?- Call clinic for new or changing moles ?- Recommend daily use of broad spectrum spf 30+ sunscreen to sun-exposed areas.  ? ?Hemangiomas ?- Red papules ?- Discussed benign nature ?- Observe ?- Call for any changes ? ?Actinic Damage ?- Chronic condition, secondary to cumulative  UV/sun exposure ?- diffuse scaly erythematous macules with underlying dyspigmentation ?- Recommend daily broad spectrum sunscreen SPF 30+ to sun-exposed areas, reapply every 2 hours as needed.  ?- Staying in the shade or wearing long sleeves, sun glasses (UVA+UVB protection) and wide brim hats (4-inch brim around the entire circumference of the hat) are also recommended for sun protection.  ?- Call for new or changing lesions. ? ?Skin cancer screening performed today. ? ?Return in about 1 year (around 12/25/2022) for TBSE. ? ?I, Rudell Cobb, CMA, am acting as scribe for Sarina Ser, MD . ?Documentation: I have reviewed the above documentation for accuracy and completeness, and I agree with the above. ? ?Sarina Ser, MD ? ?

## 2021-12-24 NOTE — Patient Instructions (Signed)

## 2021-12-28 ENCOUNTER — Ambulatory Visit (INDEPENDENT_AMBULATORY_CARE_PROVIDER_SITE_OTHER): Payer: BC Managed Care – PPO | Admitting: Vascular Surgery

## 2021-12-28 ENCOUNTER — Encounter: Payer: Self-pay | Admitting: Dermatology

## 2022-01-08 IMAGING — MG MM DIGITAL SCREENING BILAT W/ TOMO AND CAD
8 series · 8 of 24 positions shown · non-contrast
Comparison: Previous exam(s).

CLINICAL DATA: Screening.

EXAM:
DIGITAL SCREENING BILATERAL MAMMOGRAM WITH TOMOSYNTHESIS AND CAD
TECHNIQUE: Bilateral screening digital craniocaudal and mediolateral oblique
mammograms were obtained. Bilateral screening digital breast
tomosynthesis was performed. The images were evaluated with
computer-aided detection.

[R CC synth-2D]
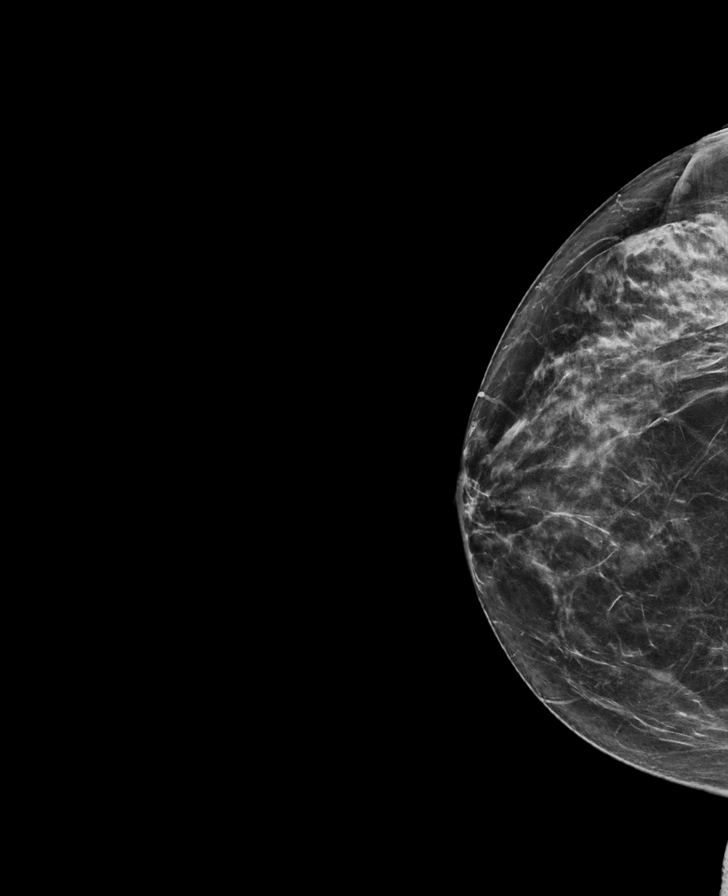

[L MLO synth-2D]
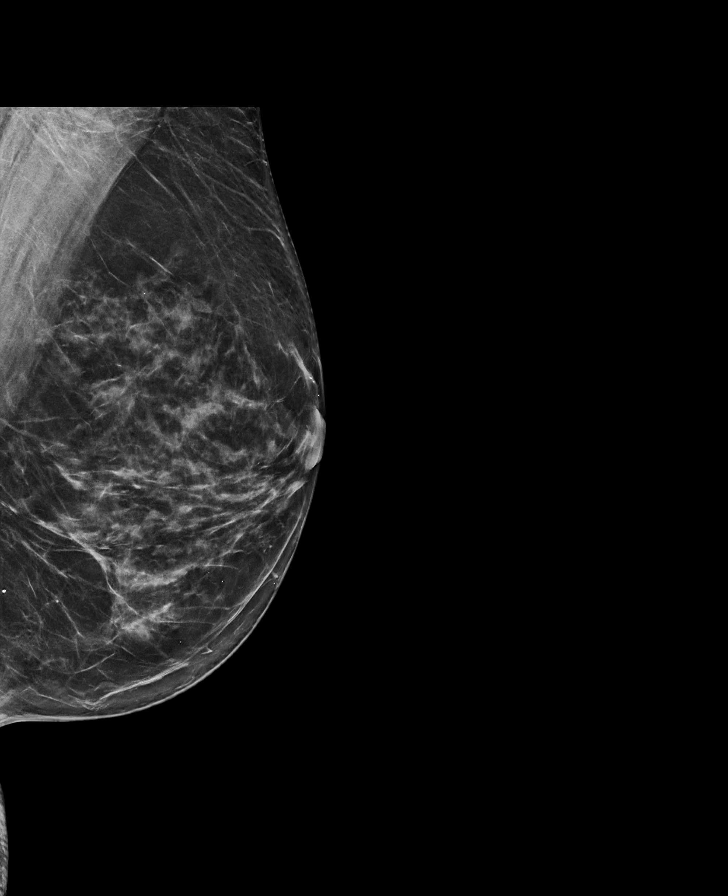

[R MLO synth-2D]
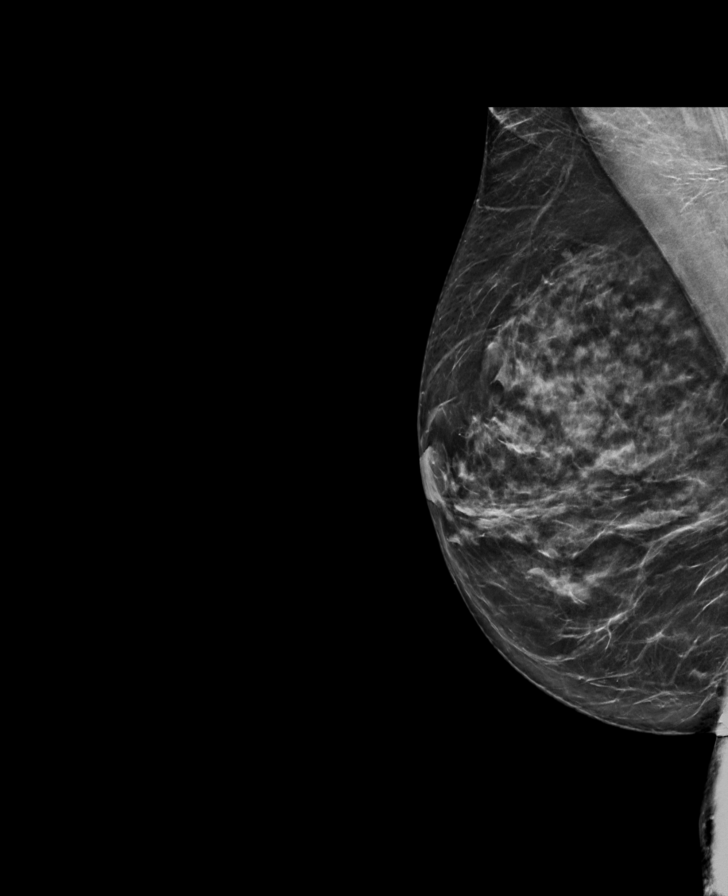

[L CC synth-2D]
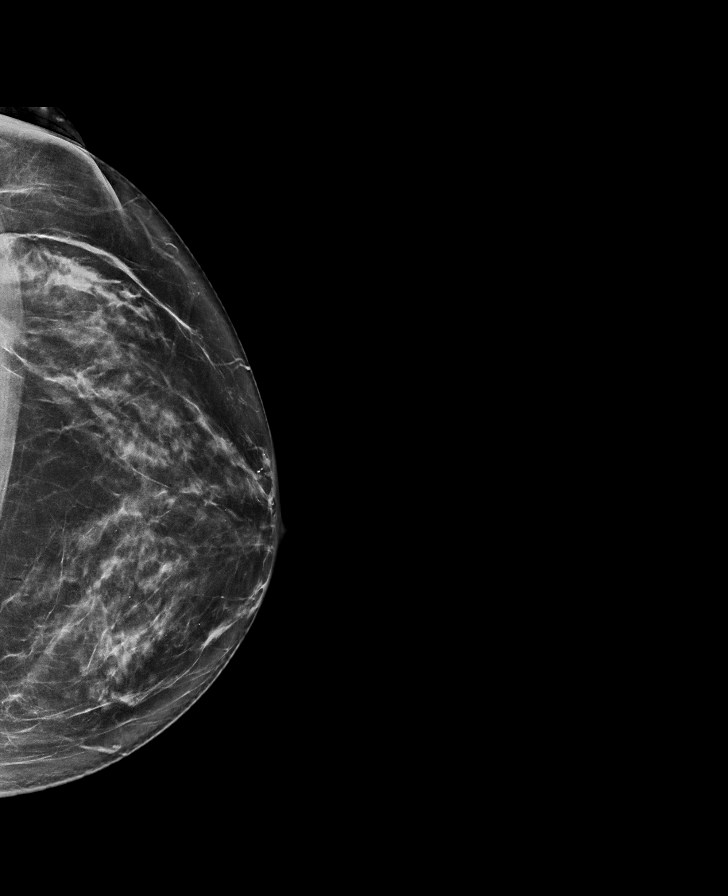

[L CC tomo · tomo slice 41/81.0]
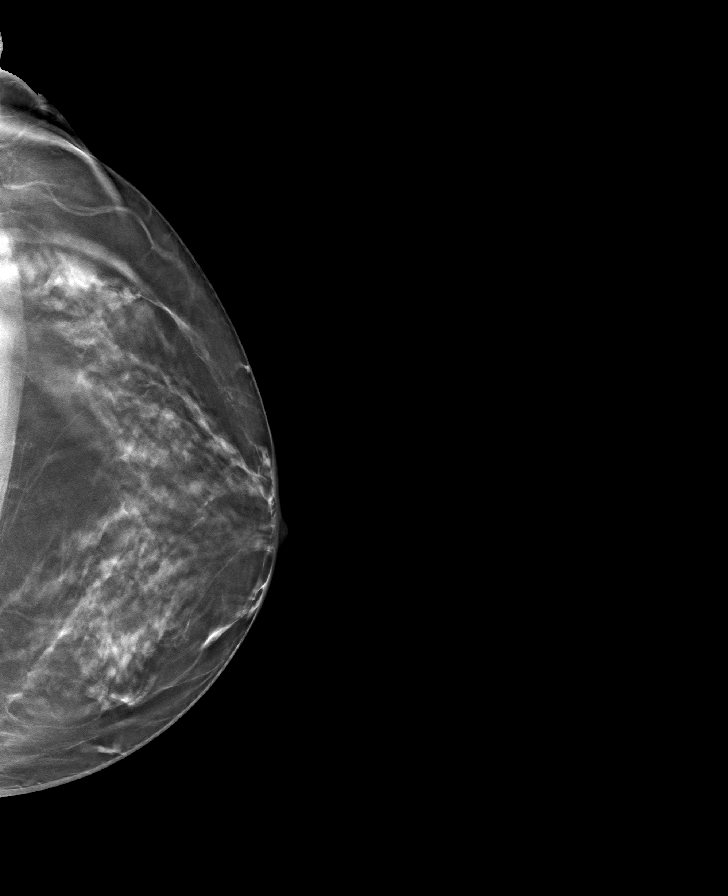

[L MLO tomo · tomo slice 37/72.0]
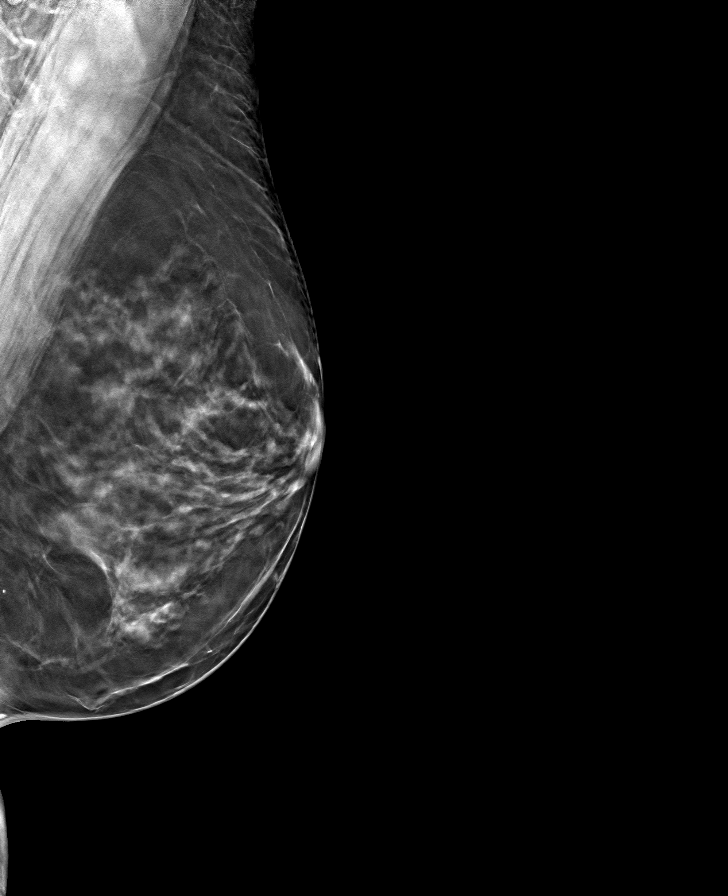

[R MLO tomo · tomo slice 37/74.0]
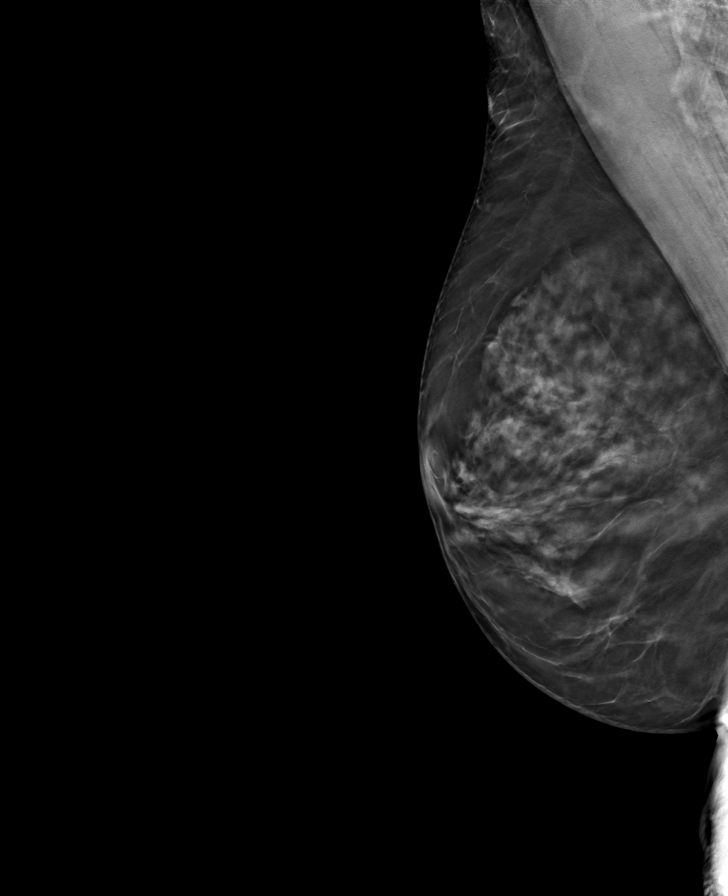

[R CC tomo · tomo slice 37/74.0]
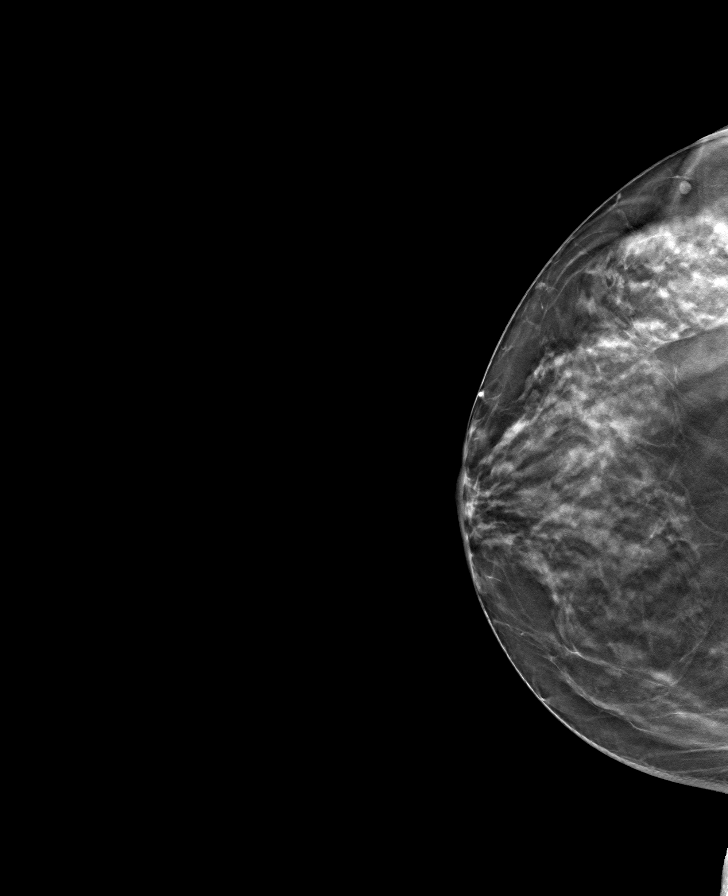

[8 of 24 positions shown; findings below may reference images not displayed]

ACR Breast Density Category c: The breast tissue is heterogeneously
dense, which may obscure small masses.
FINDINGS: There are no findings suspicious for malignancy.
IMPRESSION: No mammographic evidence of malignancy. A result letter of this
screening mammogram will be mailed directly to the patient.

RECOMMENDATION:
Screening mammogram in one year. (Code:Q3-W-BC3)

BI-RADS CATEGORY  1: Negative.

## 2022-01-15 ENCOUNTER — Encounter: Payer: Self-pay | Admitting: Internal Medicine

## 2022-01-15 ENCOUNTER — Ambulatory Visit: Payer: Self-pay | Admitting: Internal Medicine

## 2022-01-15 DIAGNOSIS — L988 Other specified disorders of the skin and subcutaneous tissue: Secondary | ICD-10-CM

## 2022-01-15 NOTE — Progress Notes (Signed)
error 

## 2022-01-31 NOTE — Progress Notes (Signed)
Pt is seen for cosmetic procedure  Consent for Botox is obtained. Area of concern is clean and treated with 30 units of Botox in the glabellar and the frontalis region she tolerated the procedure well instruction for aftercare is also given which this patient is not to do any heavy lifting or exercise and also lay down for the next 4 hours

## 2022-02-25 ENCOUNTER — Other Ambulatory Visit: Payer: Self-pay | Admitting: Internal Medicine

## 2022-02-25 DIAGNOSIS — R030 Elevated blood-pressure reading, without diagnosis of hypertension: Secondary | ICD-10-CM

## 2022-03-08 ENCOUNTER — Other Ambulatory Visit: Payer: Self-pay | Admitting: Nurse Practitioner

## 2022-03-08 MED ORDER — PHENTERMINE HCL 37.5 MG PO CAPS: 37.5000 mg | ORAL_CAPSULE | ORAL | 0 refills | Status: DC

## 2022-05-06 ENCOUNTER — Encounter: Payer: BC Managed Care – PPO | Admitting: Nurse Practitioner

## 2022-05-08 ENCOUNTER — Encounter: Payer: BC Managed Care – PPO | Admitting: Internal Medicine

## 2022-05-15 ENCOUNTER — Telehealth: Payer: Self-pay | Admitting: Nurse Practitioner

## 2022-05-15 NOTE — Telephone Encounter (Signed)
Left vm to confirm 05/21/22 appointment-Toni

## 2022-05-21 ENCOUNTER — Ambulatory Visit (INDEPENDENT_AMBULATORY_CARE_PROVIDER_SITE_OTHER): Payer: BC Managed Care – PPO | Admitting: Nurse Practitioner

## 2022-05-21 ENCOUNTER — Encounter: Payer: Self-pay | Admitting: Nurse Practitioner

## 2022-05-21 VITALS — BP 127/81 | HR 85 | Temp 97.6°F | Resp 16 | Ht 69.0 in | Wt 190.6 lb

## 2022-05-21 DIAGNOSIS — R300 Dysuria: Secondary | ICD-10-CM

## 2022-05-21 DIAGNOSIS — R7301 Impaired fasting glucose: Secondary | ICD-10-CM

## 2022-05-21 DIAGNOSIS — E782 Mixed hyperlipidemia: Secondary | ICD-10-CM

## 2022-05-21 DIAGNOSIS — E039 Hypothyroidism, unspecified: Secondary | ICD-10-CM

## 2022-05-21 DIAGNOSIS — I10 Essential (primary) hypertension: Secondary | ICD-10-CM

## 2022-05-21 DIAGNOSIS — Z0001 Encounter for general adult medical examination with abnormal findings: Secondary | ICD-10-CM

## 2022-05-21 DIAGNOSIS — E559 Vitamin D deficiency, unspecified: Secondary | ICD-10-CM

## 2022-05-21 DIAGNOSIS — E663 Overweight: Secondary | ICD-10-CM

## 2022-05-21 DIAGNOSIS — Z6828 Body mass index (BMI) 28.0-28.9, adult: Secondary | ICD-10-CM

## 2022-05-21 DIAGNOSIS — J4521 Mild intermittent asthma with (acute) exacerbation: Secondary | ICD-10-CM

## 2022-05-21 MED ORDER — PHENTERMINE HCL 37.5 MG PO CAPS: 37.5000 mg | ORAL_CAPSULE | ORAL | 0 refills | Status: DC

## 2022-05-21 NOTE — Progress Notes (Signed)
[facility] [place], [place] [address]  Internal MEDICINE  Office Visit Note  Patient Name: Susan Griffith  [id number]  [id number]  Date of Service: 05/21/2022  Chief Complaint  Patient presents with   Annual Exam    HPI Danniella presents for an annual well visit and physical exam. Well-appearing 56 year old female with hypertension, hypothyroidism, mild asthma, and sarcoidosis.  --routine screening mammogram due.  --negative cologuard test in August 2021, due next in 2024.  --due for routine labs --due for medication refills  --pap smear done sept 2021, due in 2024 but can wait up to five years which would be 2026, per patient preference.  --requesting to have prescription for ivermectin to keep on hand in case of covid infection      Current Medication: Outpatient Encounter Medications as of 05/21/2022  Medication Sig   albuterol (VENTOLIN HFA) 108 (90 Base) MCG/ACT inhaler Inhale 2 puffs into the lungs every 6 (six) hours as needed for wheezing or shortness of breath.   topiramate (TOPAMAX) 25 MG tablet Take one tab po bid   [DISCONTINUED] hydrochlorothiazide (HYDRODIURIL) 12.5 MG tablet TAKE 1 TABLET(12.5 MG) BY MOUTH DAILY   [DISCONTINUED] phentermine 37.5 MG capsule Take 1 capsule (37.5 mg total) by mouth every morning.   [DISCONTINUED] Semaglutide-Weight Management 0.5 MG/0.5ML SOAJ Inject 0.5 mg into the skin once a week.   phentermine 37.5 MG capsule Take 1 capsule (37.5 mg total) by mouth every morning.   No facility-administered encounter medications on file as of 05/21/2022.    Surgical History: Past Surgical History:  Procedure Laterality Date   child birth     natural   thigh surgery  10/22/2018    Medical History: Past Medical History:  Diagnosis Date   Migraines    Sarcoidosis    Thyroid nodule     Family History: Family History  Problem Relation Age of Onset   Hyperlipidemia Mother    Hypertension Mother      Social History   Socioeconomic History   Marital status: Married    Spouse name: Not on file   Number of children: Not on file   Years of education: Not on file   Highest education level: Not on file  Occupational History   Not on file  Tobacco Use   Smoking status: Never   Smokeless tobacco: Never  Substance and Sexual Activity   Alcohol use: Yes    Comment: ocassionally   Drug use: Never   Sexual activity: Not on file  Other Topics Concern   Not on file  Social History Narrative   Not on file   Social Determinants of Health   Financial Resource Strain: Not on file  Food Insecurity: Not on file  Transportation Needs: Not on file  Physical Activity: Not on file  Stress: Not on file  Social Connections: Not on file  Intimate Partner Violence: Not on file      Review of Systems  Constitutional:  Negative for activity change, appetite change, chills, fatigue, fever and unexpected weight change.  HENT: Negative.  Negative for congestion, ear pain, rhinorrhea, sore throat and trouble swallowing.   Eyes: Negative.   Respiratory: Negative.  Negative for cough, chest tightness, shortness of breath and wheezing.   Cardiovascular: Negative.  Negative for chest pain.  Gastrointestinal: Negative.  Negative for abdominal pain, blood in stool, constipation, diarrhea, nausea and vomiting.  Endocrine: Negative.   Genitourinary: Negative.  Negative for difficulty urinating, dysuria, frequency, hematuria  and urgency.  Musculoskeletal: Negative.  Negative for arthralgias, back pain, joint swelling, myalgias and neck pain.  Skin: Negative.  Negative for rash and wound.  Allergic/Immunologic: Negative.  Negative for immunocompromised state.  Neurological: Negative.  Negative for dizziness, seizures, numbness and headaches.  Hematological: Negative.   Psychiatric/Behavioral: Negative.  Negative for behavioral problems, self-injury and suicidal ideas. The patient is not  nervous/anxious.     Vital Signs: BP 127/81   Pulse 85   Temp 97.6 F (36.4 C)   Resp 16   Ht _0  (1.753 m)   Wt 190 lb 9.6 oz (86.5 kg)   SpO2 98%   BMI 28.15 kg/m    Physical Exam Vitals reviewed.  Constitutional:      General: She is not in acute distress.    Appearance: Normal appearance. She is well-developed. She is not ill-appearing or diaphoretic.  HENT:     Head: Normocephalic and atraumatic.     Right Ear: Tympanic membrane, ear canal and external ear normal.     Left Ear: Tympanic membrane, ear canal and external ear normal.     Nose: Nose normal. No congestion or rhinorrhea.     Mouth/Throat:     Mouth: Mucous membranes are moist.     Pharynx: Oropharynx is clear. No oropharyngeal exudate or posterior oropharyngeal erythema.  Eyes:     General: No scleral icterus.       Right eye: No discharge.        Left eye: No discharge.     Extraocular Movements: Extraocular movements intact.     Conjunctiva/sclera: Conjunctivae normal.     Pupils: Pupils are equal, round, and reactive to light.  Neck:     Thyroid: No thyromegaly.     Vascular: No JVD.     Trachea: No tracheal deviation.  Cardiovascular:     Rate and Rhythm: Normal rate and regular rhythm.     Pulses: Normal pulses.     Heart sounds: Normal heart sounds. No murmur heard.    No friction rub. No gallop.  Pulmonary:     Effort: Pulmonary effort is normal. No respiratory distress.     Breath sounds: Normal breath sounds. No stridor. No wheezing or rales.  Chest:     Chest wall: No tenderness.  Abdominal:     General: Bowel sounds are normal. There is no distension.     Palpations: Abdomen is soft. There is no mass.     Tenderness: There is no abdominal tenderness. There is no guarding or rebound.  Musculoskeletal:        General: No tenderness or deformity. Normal range of motion.     Cervical back: Normal range of motion and neck supple.  Lymphadenopathy:     Cervical: No cervical adenopathy.   Skin:    General: Skin is warm and dry.     Capillary Refill: Capillary refill takes less than 2 seconds.     Coloration: Skin is not pale.     Findings: No erythema or rash.  Neurological:     Mental Status: She is alert and oriented to person, place, and time.     Cranial Nerves: No cranial nerve deficit.     Motor: No abnormal muscle tone.     Coordination: Coordination normal.     Deep Tendon Reflexes: Reflexes are normal and symmetric.  Psychiatric:        Behavior: Behavior normal.        Thought Content: Thought content normal.  Judgment: Judgment normal.        Assessment/Plan: 1. Encounter for general adult medical examination with abnormal findings Age-appropriate preventive screenings and vaccinations discussed, annual physical exam completed. Routine labs for health maintenance ordered, see below. PHM updated.  - CMP14+EGFR - CBC with Differential/Platelet - Lipid Profile - TSH + free T4 - Vitamin D (25 hydroxy)  2. Acquired hypothyroidism Routine labs ordered, not currently on levothyroxine.  - CMP14+EGFR - CBC with Differential/Platelet - TSH + free T4  3. Vitamin D deficiency Routine lab ordered - Vitamin D (25 hydroxy)  4. Mixed hyperlipidemia Routine lab ordered - Lipid Profile  5. Overweight with body mass index (BMI) of 28 to 28.9 in adult Phentermine prescribed x1 month, follow up in 4 weeks for weigh in and refill - phentermine 37.5 MG capsule; Take 1 capsule (37.5 mg total) by mouth every morning.  Dispense: 30 capsule; Refill: 0  6. Dysuria Routine urinalysis done - UA/M w/rflx Culture, Routine - Microscopic Examination     General Counseling: Sherry verbalizes understanding of the findings of todays visit and agrees with plan of treatment. I have discussed any further diagnostic evaluation that may be needed or ordered today. We also reviewed her medications today. she has been encouraged to call the office with any questions or  concerns that should arise related to todays visit.    Orders Placed This Encounter  Procedures   Microscopic Examination   UA/M w/rflx Culture, Routine   CMP14+EGFR   CBC with Differential/Platelet   Lipid Profile   TSH + free T4   Vitamin D (25 hydroxy)    Meds ordered this encounter  Medications   phentermine 37.5 MG capsule    Sig: Take 1 capsule (37.5 mg total) by mouth every morning.    Dispense:  30 capsule    Refill:  0    Return in about 4 weeks (around 06/18/2022) for F/U, Weight loss, Maya Scholer PCP.   Total time spent:30 Minutes Time spent includes review of chart, medications, test results, and follow up plan with the patient.   [place] Controlled Substance Database was reviewed by me.  This patient was seen by Jonetta Osgood, FNP-C in collaboration with Dr. Clayborn Bigness as a part of collaborative care agreement.  Cortez Steelman R. Valetta Fuller, MSN, FNP-C Internal medicine

## 2022-05-22 LAB — UA/M W/RFLX CULTURE, ROUTINE
Bilirubin, UA: NEGATIVE
Glucose, UA: NEGATIVE
Ketones, UA: NEGATIVE
Leukocytes,UA: NEGATIVE
Nitrite, UA: NEGATIVE
Protein,UA: NEGATIVE
RBC, UA: NEGATIVE
Specific Gravity, UA: 1.018 (ref 1.005–1.030)
Urobilinogen, Ur: 0.2 mg/dL (ref 0.2–1.0)
pH, UA: 7 (ref 5.0–7.5)

## 2022-05-22 LAB — MICROSCOPIC EXAMINATION
Bacteria, UA: NONE SEEN
Casts: NONE SEEN /lpf
WBC, UA: NONE SEEN /hpf (ref 0–5)

## 2022-06-14 ENCOUNTER — Telehealth: Payer: Self-pay

## 2022-06-14 NOTE — Telephone Encounter (Signed)
Patient called to say she was not able to receive her Phentermine from her pharmacy. Patient states "they did not have any pills". I tried to call patient back to get more clarification but I could not get in touch with her. I left a message and asked her to please call me back for clarification so that I can let Alyssa know if a new prescription or refill needs to be placed.

## 2022-06-18 ENCOUNTER — Ambulatory Visit: Payer: BC Managed Care – PPO | Admitting: Nurse Practitioner

## 2022-07-02 ENCOUNTER — Other Ambulatory Visit: Payer: Self-pay | Admitting: Internal Medicine

## 2022-07-02 DIAGNOSIS — R030 Elevated blood-pressure reading, without diagnosis of hypertension: Secondary | ICD-10-CM

## 2022-07-14 ENCOUNTER — Encounter: Payer: Self-pay | Admitting: Nurse Practitioner

## 2022-07-23 ENCOUNTER — Other Ambulatory Visit: Payer: Self-pay | Admitting: Internal Medicine

## 2022-07-23 DIAGNOSIS — G43809 Other migraine, not intractable, without status migrainosus: Secondary | ICD-10-CM

## 2022-08-20 ENCOUNTER — Encounter: Payer: Self-pay | Admitting: Internal Medicine

## 2022-08-20 ENCOUNTER — Ambulatory Visit: Payer: BC Managed Care – PPO | Admitting: Internal Medicine

## 2022-08-20 VITALS — BP 139/81 | HR 78 | Temp 98.1°F | Resp 16 | Ht 69.0 in | Wt 198.2 lb

## 2022-08-20 DIAGNOSIS — Z6829 Body mass index (BMI) 29.0-29.9, adult: Secondary | ICD-10-CM | POA: Diagnosis not present

## 2022-08-20 DIAGNOSIS — N959 Unspecified menopausal and perimenopausal disorder: Secondary | ICD-10-CM | POA: Diagnosis not present

## 2022-08-20 DIAGNOSIS — N952 Postmenopausal atrophic vaginitis: Secondary | ICD-10-CM

## 2022-08-20 MED ORDER — ESTRADIOL 0.5 MG/0.5GM TD GEL: TRANSDERMAL | 6 refills | Status: DC

## 2022-08-20 MED ORDER — ESTRADIOL 10 MCG VA TABS: ORAL_TABLET | VAGINAL | 12 refills | Status: DC

## 2022-08-20 NOTE — Progress Notes (Signed)
[facility] [place], [place] [address]  Internal MEDICINE  Office Visit Note  Patient Name: Susan Griffith  [id number]  [id number]  Date of Service: 09/05/2022  Chief Complaint  Patient presents with   Follow-up    HPI Patient is here for routine follow-up Complaints of hot flashes and painful intercourse patient has not had her cycle for a while She does not have any history of breast or uterine cancer Continue on her weight loss   Current Medication: Outpatient Encounter Medications as of 08/20/2022  Medication Sig   albuterol (VENTOLIN HFA) 108 (90 Base) MCG/ACT inhaler Inhale 2 puffs into the lungs every 6 (six) hours as needed for wheezing or shortness of breath.   Estradiol 0.5 MG/0.5GM GEL Apply one patch 2 x a week to dry skin   Estradiol 10 MCG TABS vaginal tablet Insert one intravaginal tab once a week   hydrochlorothiazide (HYDRODIURIL) 12.5 MG tablet TAKE 1 TABLET(12.5 MG) BY MOUTH DAILY   phentermine 37.5 MG capsule Take 1 capsule (37.5 mg total) by mouth every morning.   topiramate (TOPAMAX) 25 MG tablet TAKE 1 TABLET BY MOUTH TWICE DAILY   No facility-administered encounter medications on file as of 08/20/2022.    Surgical History: Past Surgical History:  Procedure Laterality Date   child birth     natural   thigh surgery  10/22/2018    Medical History: Past Medical History:  Diagnosis Date   Migraines    Sarcoidosis    Thyroid nodule     Family History: Family History  Problem Relation Age of Onset   Hyperlipidemia Mother    Hypertension Mother     Social History   Socioeconomic History   Marital status: Married    Spouse name: Not on file   Number of children: Not on file   Years of education: Not on file   Highest education level: Not on file  Occupational History   Not on file  Tobacco Use   Smoking status: Never   Smokeless tobacco: Never  Substance and Sexual Activity   Alcohol use: Yes     Comment: ocassionally   Drug use: Never   Sexual activity: Not on file  Other Topics Concern   Not on file  Social History Narrative   Not on file   Social Determinants of Health   Financial Resource Strain: Not on file  Food Insecurity: Not on file  Transportation Needs: Not on file  Physical Activity: Not on file  Stress: Not on file  Social Connections: Not on file  Intimate Partner Violence: Not on file      Review of Systems  Constitutional:  Negative for chills, fatigue, fever and unexpected weight change.  HENT:  Negative for congestion, mouth sores, postnasal drip, rhinorrhea, sneezing and sore throat.   Eyes:  Negative for redness.  Respiratory:  Negative for cough, chest tightness and shortness of breath.   Cardiovascular:  Negative for chest pain and palpitations.  Gastrointestinal:  Negative for abdominal pain, constipation, diarrhea, nausea and vomiting.  Endocrine: Positive for cold intolerance and heat intolerance.  Genitourinary:  Positive for dyspareunia. Negative for dysuria, flank pain and frequency.       Vaginal dryness   Musculoskeletal:  Negative for arthralgias, back pain, joint swelling and neck pain.  Skin:  Negative for rash.  Neurological: Negative.  Negative for tremors and numbness.  Hematological:  Negative for adenopathy. Does not bruise/bleed easily.  Psychiatric/Behavioral: Negative.  Negative for behavioral  problems (Depression), sleep disturbance and suicidal ideas. The patient is not nervous/anxious.     Vital Signs: BP 139/81   Pulse 78   Temp 98.1 F (36.7 C)   Resp 16   Ht '5\' 9"'$  (1.753 m)   Wt 198 lb 3.2 oz (89.9 kg)   SpO2 98%   BMI 29.27 kg/m    Physical Exam Constitutional:      Appearance: Normal appearance.  HENT:     Head: Normocephalic and atraumatic.     Nose: Nose normal.     Mouth/Throat:     Mouth: Mucous membranes are moist.     Pharynx: No posterior oropharyngeal erythema.  Eyes:     Extraocular  Movements: Extraocular movements intact.     Pupils: Pupils are equal, round, and reactive to light.  Cardiovascular:     Pulses: Normal pulses.     Heart sounds: Normal heart sounds.  Pulmonary:     Effort: Pulmonary effort is normal.     Breath sounds: Normal breath sounds.  Neurological:     General: No focal deficit present.     Mental Status: She is alert.  Psychiatric:        Mood and Affect: Mood normal.        Behavior: Behavior normal.       Assessment/Plan: 1. Atrophic vaginitis Will start low-dose HRT will taper down after couple of months - Estradiol 10 MCG TABS vaginal tablet; Insert one intravaginal tab once a week  Dispense: 4 tablet; Refill: 12  2. Menopausal disorder Patient also started on the patch and eventually will taper down to the minimum possible dose without any side effects - Estradiol 0.5 MG/0.5GM GEL; Apply one patch 2 x a week to dry skin  Dispense: 8 each; Refill: 6  3. BMI 29.0-29.9,adult Continue to monitor her calories, exercise as prescribed  General Counseling: Susan Griffith verbalizes understanding of the findings of todays visit and agrees with plan of treatment. I have discussed any further diagnostic evaluation that may be needed or ordered today. We also reviewed her medications today. she has been encouraged to call the office with any questions or concerns that should arise related to todays visit.    No orders of the defined types were placed in this encounter.   Meds ordered this encounter  Medications   Estradiol 0.5 MG/0.5GM GEL    Sig: Apply one patch 2 x a week to dry skin    Dispense:  8 each    Refill:  6   Estradiol 10 MCG TABS vaginal tablet    Sig: Insert one intravaginal tab once a week    Dispense:  4 tablet    Refill:  12    Total time spent:35 Minutes Time spent includes review of chart, medications, test results, and follow up plan with the patient.   [place] Controlled Substance Database was reviewed by me.   Dr  Lavera Guise Internal medicine

## 2022-09-13 ENCOUNTER — Other Ambulatory Visit: Payer: Self-pay | Admitting: Internal Medicine

## 2022-09-13 DIAGNOSIS — Z1231 Encounter for screening mammogram for malignant neoplasm of breast: Secondary | ICD-10-CM

## 2022-09-18 ENCOUNTER — Other Ambulatory Visit: Payer: Self-pay | Admitting: Internal Medicine

## 2022-09-18 DIAGNOSIS — Z6828 Body mass index (BMI) 28.0-28.9, adult: Secondary | ICD-10-CM

## 2022-09-18 MED ORDER — PHENTERMINE HCL 37.5 MG PO CAPS: 37.5000 mg | ORAL_CAPSULE | ORAL | 0 refills | Status: DC

## 2022-09-19 ENCOUNTER — Ambulatory Visit
Admission: RE | Admit: 2022-09-19 | Discharge: 2022-09-19 | Disposition: A | Discharge status: Home / Self Care | Payer: BC Managed Care – PPO | Source: Ambulatory Visit | Attending: Internal Medicine | Admitting: Internal Medicine

## 2022-09-19 DIAGNOSIS — Z1231 Encounter for screening mammogram for malignant neoplasm of breast: Secondary | ICD-10-CM | POA: Diagnosis not present

## 2022-10-03 ENCOUNTER — Other Ambulatory Visit: Payer: Self-pay

## 2022-10-03 MED ORDER — ESTRADIOL 0.0375 MG/24HR TD PTTW: 1.0000 | MEDICATED_PATCH | TRANSDERMAL | 1 refills | Status: DC

## 2022-10-03 NOTE — Telephone Encounter (Signed)
As per dr Humphrey Rolls send estradiol patch

## 2022-11-08 ENCOUNTER — Other Ambulatory Visit: Payer: Self-pay | Admitting: Internal Medicine

## 2023-01-08 ENCOUNTER — Ambulatory Visit: Payer: BC Managed Care – PPO | Admitting: Dermatology

## 2023-01-27 ENCOUNTER — Ambulatory Visit: Payer: BC Managed Care – PPO | Admitting: Internal Medicine

## 2023-01-27 ENCOUNTER — Encounter: Payer: Self-pay | Admitting: Internal Medicine

## 2023-01-27 ENCOUNTER — Telehealth: Payer: Self-pay | Admitting: Internal Medicine

## 2023-01-27 VITALS — BP 120/82 | HR 63 | Temp 97.6°F | Resp 16 | Ht 69.0 in | Wt 196.4 lb

## 2023-01-27 DIAGNOSIS — Z79890 Hormone replacement therapy (postmenopausal): Secondary | ICD-10-CM

## 2023-01-27 DIAGNOSIS — N9089 Other specified noninflammatory disorders of vulva and perineum: Secondary | ICD-10-CM

## 2023-01-27 DIAGNOSIS — I8003 Phlebitis and thrombophlebitis of superficial vessels of lower extremities, bilateral: Secondary | ICD-10-CM

## 2023-01-27 DIAGNOSIS — I10 Essential (primary) hypertension: Secondary | ICD-10-CM | POA: Diagnosis not present

## 2023-01-27 DIAGNOSIS — F4381 Prolonged grief disorder: Secondary | ICD-10-CM | POA: Diagnosis not present

## 2023-01-27 MED ORDER — PHENTERMINE HCL 15 MG PO CAPS: 15.0000 mg | ORAL_CAPSULE | ORAL | 0 refills | Status: DC

## 2023-01-27 NOTE — Progress Notes (Signed)
[facility] [address] [place], [place] [address]  Internal MEDICINE  Office Visit Note  Patient Name: Susan Griffith  [id number]  [id number]  Date of Service: 02/04/2023  Chief Complaint  Patient presents with   Follow-up    HPI  Pt is here for routine follow up Has been grieving about her dad's death. Now it has been one year, was able to talk to her Renato Gails, she does not want to take any medications at the moment  Concerned about a sore spot around her labia, denies any rash She is c/o leg pain and cramping at night, she has been seen by vascular but wants a second opinion Trying to keep her weight under control     Current Medication: Outpatient Encounter Medications as of 01/27/2023  Medication Sig   albuterol (VENTOLIN HFA) 108 (90 Base) MCG/ACT inhaler Inhale 2 puffs into the lungs every 6 (six) hours as needed for wheezing or shortness of breath.   hydrochlorothiazide (HYDRODIURIL) 12.5 MG tablet TAKE 1 TABLET(12.5 MG) BY MOUTH DAILY   phentermine 15 MG capsule Take 1 capsule (15 mg total) by mouth every morning.   [DISCONTINUED] estradiol (VIVELLE-DOT) 0.0375 MG/24HR APPLY 1 PATCH TOPICALLY TO THE SKIN 2 TIMES A WEEK   [DISCONTINUED] Estradiol 10 MCG TABS vaginal tablet Insert one intravaginal tab once a week   [DISCONTINUED] phentermine 37.5 MG capsule Take 1 capsule (37.5 mg total) by mouth every morning.   [DISCONTINUED] topiramate (TOPAMAX) 25 MG tablet TAKE 1 TABLET BY MOUTH TWICE DAILY   estradiol (VIVELLE-DOT) 0.0375 MG/24HR APPLY 1 PATCH TOPICALLY TO THE SKIN 2 TIMES A WEEK   No facility-administered encounter medications on file as of 01/27/2023.    Surgical History: Past Surgical History:  Procedure Laterality Date   child birth     natural   thigh surgery  10/22/2018    Medical History: Past Medical History:  Diagnosis Date   Migraines    Sarcoidosis    Thyroid nodule     Family History: Family History  Problem  Relation Age of Onset   Hyperlipidemia Mother    Hypertension Mother     Social History   Socioeconomic History   Marital status: Married    Spouse name: Not on file   Number of children: Not on file   Years of education: Not on file   Highest education level: Not on file  Occupational History   Not on file  Tobacco Use   Smoking status: Never   Smokeless tobacco: Never  Substance and Sexual Activity   Alcohol use: Yes    Comment: ocassionally   Drug use: Never   Sexual activity: Not on file  Other Topics Concern   Not on file  Social History Narrative   Not on file   Social Determinants of Health   Financial Resource Strain: Not on file  Food Insecurity: Not on file  Transportation Needs: Not on file  Physical Activity: Not on file  Stress: Not on file  Social Connections: Not on file  Intimate Partner Violence: Not on file      Review of Systems  Constitutional:  Negative for chills, fatigue and unexpected weight change.  HENT:  Negative for congestion, postnasal drip, rhinorrhea, sneezing and sore throat.   Eyes:  Negative for redness.  Respiratory:  Negative for cough, chest tightness and shortness of breath.   Cardiovascular:  Negative for chest pain and palpitations.  Gastrointestinal:  Negative for abdominal pain, constipation, diarrhea, nausea and  vomiting.  Genitourinary:  Negative for dysuria and frequency.  Musculoskeletal:  Negative for arthralgias, back pain, joint swelling and neck pain.  Skin:  Negative for rash.       Left labia pain   Neurological: Negative.  Negative for tremors and numbness.  Hematological:  Negative for adenopathy. Does not bruise/bleed easily.  Psychiatric/Behavioral:  Positive for dysphoric mood. Negative for behavioral problems (Depression), sleep disturbance and suicidal ideas. The patient is not nervous/anxious.     Vital Signs: BP 120/82   Pulse 63   Temp 97.6 F (36.4 C)   Resp 16   Ht 5\' 9"  (1.753 m)   Wt 196  lb 6.4 oz (89.1 kg)   SpO2 97%   BMI 29.00 kg/m    Physical Exam Constitutional:      Appearance: Normal appearance.  HENT:     Head: Normocephalic and atraumatic.     Nose: Nose normal.     Mouth/Throat:     Mouth: Mucous membranes are moist.     Pharynx: No posterior oropharyngeal erythema.  Eyes:     Extraocular Movements: Extraocular movements intact.     Pupils: Pupils are equal, round, and reactive to light.  Cardiovascular:     Pulses: Normal pulses.     Heart sounds: Normal heart sounds.  Pulmonary:     Effort: Pulmonary effort is normal.     Breath sounds: Normal breath sounds.  Skin:    Findings: Erythema and lesion present.     Comments: Left labia ( looks like a tear)   Neurological:     General: No focal deficit present.     Mental Status: She is alert.  Psychiatric:        Mood and Affect: Mood normal.        Behavior: Behavior normal.        Assessment/Plan: 1. 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 Hormone replacement therapy (postmenopausal) Doing well  - estradiol (VIVELLE-DOT) 0.0375 MG/24HR; APPLY 1 PATCH TOPICALLY TO THE SKIN 2 TIMES A WEEK  Dispense: 24 patch; Refill: 12  3. Benign hypertension Continue to monitor   4. Labial lesion Small skin tear but she is concerned, will refer to derm, advised to use an emmollient OTC - Ambulatory referral to Dermatology  5. Superficial thrombophlebitis of both legs Continued pain and burning, needs second opinion, not pleased with results  - Ambulatory referral to Dermatology   General Counseling: Narcisa verbalizes understanding of the findings of todays visit and agrees with plan of treatment. I have discussed any further diagnostic evaluation that may be needed or ordered today. We also reviewed her medications today. she has been encouraged to call the office with any questions or  concerns that should arise related to todays visit.    Orders Placed This Encounter  Procedures   Ambulatory referral to Dermatology    Meds ordered this encounter  Medications   phentermine 15 MG capsule    Sig: Take 1 capsule (15 mg total) by mouth every morning.    Dispense:  30 capsule    Refill:  0   estradiol (VIVELLE-DOT) 0.0375 MG/24HR    Sig: APPLY 1 PATCH TOPICALLY TO THE SKIN 2 TIMES A WEEK    Dispense:  24 patch    Refill:  12    ZERO refills remain on this prescription. Your patient is requesting advance  approval of refills for this medication to PREVENT ANY MISSED DOSES    Total time spent:35 Minutes Time spent includes review of chart, medications, test results, and follow up plan with the patient.   [place] Controlled Substance Database was reviewed by me.   Dr Lyndon Code Internal medicine

## 2023-01-27 NOTE — Telephone Encounter (Signed)
Awaiting 01/27/23 office notes for Dermatology referral-Toni

## 2023-01-28 ENCOUNTER — Other Ambulatory Visit: Payer: Self-pay | Admitting: Internal Medicine

## 2023-01-28 ENCOUNTER — Telehealth: Payer: Self-pay

## 2023-01-28 DIAGNOSIS — G43809 Other migraine, not intractable, without status migrainosus: Secondary | ICD-10-CM

## 2023-01-28 NOTE — Telephone Encounter (Signed)
Completed P.A. for patient's Phentermine.

## 2023-02-04 ENCOUNTER — Telehealth: Payer: Self-pay | Admitting: Internal Medicine

## 2023-02-04 MED ORDER — ESTRADIOL 0.0375 MG/24HR TD PTTW: MEDICATED_PATCH | TRANSDERMAL | 12 refills | Status: DC

## 2023-02-04 NOTE — Telephone Encounter (Signed)
Dermatology referral faxed to Dr. Sallye Lat in [place]; [phone]

## 2023-02-08 HISTORY — PX: AUGMENTATION MAMMAPLASTY: SUR837

## 2023-02-11 ENCOUNTER — Other Ambulatory Visit: Payer: Self-pay

## 2023-02-11 ENCOUNTER — Emergency Department (HOSPITAL_COMMUNITY): Payer: BC Managed Care – PPO

## 2023-02-11 ENCOUNTER — Emergency Department (HOSPITAL_COMMUNITY)
Admission: EM | Admit: 2023-02-11 | Discharge: 2023-02-11 | Disposition: A | Discharge status: Home / Self Care | Payer: BC Managed Care – PPO | Attending: Emergency Medicine | Admitting: Emergency Medicine

## 2023-02-11 ENCOUNTER — Encounter (HOSPITAL_COMMUNITY): Payer: Self-pay

## 2023-02-11 DIAGNOSIS — R079 Chest pain, unspecified: Secondary | ICD-10-CM | POA: Diagnosis not present

## 2023-02-11 DIAGNOSIS — E039 Hypothyroidism, unspecified: Secondary | ICD-10-CM | POA: Insufficient documentation

## 2023-02-11 DIAGNOSIS — R0789 Other chest pain: Secondary | ICD-10-CM | POA: Diagnosis not present

## 2023-02-11 DIAGNOSIS — R110 Nausea: Secondary | ICD-10-CM | POA: Insufficient documentation

## 2023-02-11 DIAGNOSIS — I10 Essential (primary) hypertension: Secondary | ICD-10-CM | POA: Diagnosis not present

## 2023-02-11 DIAGNOSIS — J9811 Atelectasis: Secondary | ICD-10-CM | POA: Diagnosis not present

## 2023-02-11 DIAGNOSIS — R1011 Right upper quadrant pain: Secondary | ICD-10-CM | POA: Diagnosis not present

## 2023-02-11 LAB — TROPONIN I (HIGH SENSITIVITY)
Troponin I (High Sensitivity): 6 ng/L (ref <18)
Troponin I (High Sensitivity): 8 ng/L (ref <18)

## 2023-02-11 LAB — BASIC METABOLIC PANEL
Anion gap: 9 (ref 5–15)
BUN: 15 mg/dL (ref 6–20)
CO2: 26 mmol/L (ref 22–32)
Calcium: 9.3 mg/dL (ref 8.9–10.3)
Chloride: 105 mmol/L (ref 98–111)
Creatinine, Ser: 0.84 mg/dL (ref 0.44–1.00)
GFR, Estimated: >60 mL/min (ref >60)
Glucose, Bld: 98 mg/dL (ref 70–99)
Potassium: 4.3 mmol/L (ref 3.5–5.1)
Sodium: 140 mmol/L (ref 135–145)

## 2023-02-11 LAB — CBC
HCT: 43 % (ref 36.0–46.0)
Hemoglobin: 14.2 g/dL (ref 12.0–15.0)
MCH: 29.3 pg (ref 26.0–34.0)
MCHC: 33 g/dL (ref 30.0–36.0)
MCV: 88.7 fL (ref 80.0–100.0)
Platelets: 305 10*3/uL (ref 150–400)
RBC: 4.85 MIL/uL (ref 3.87–5.11)
RDW: 12.7 % (ref 11.5–15.5)
WBC: 6.6 10*3/uL (ref 4.0–10.5)
nRBC: 0 % (ref 0.0–0.2)

## 2023-02-11 LAB — MAGNESIUM: Magnesium: 2.1 mg/dL (ref 1.7–2.4)

## 2023-02-11 LAB — HEPATIC FUNCTION PANEL
ALT: 19 U/L (ref 0–44)
AST: 19 U/L (ref 15–41)
Albumin: 3.9 g/dL (ref 3.5–5.0)
Alkaline Phosphatase: 80 U/L (ref 38–126)
Bilirubin, Direct: <0.1 mg/dL (ref 0.0–0.2)
Total Bilirubin: 0.5 mg/dL (ref 0.3–1.2)
Total Protein: 6.8 g/dL (ref 6.5–8.1)

## 2023-02-11 LAB — LIPASE, BLOOD: Lipase: 35 U/L (ref 11–51)

## 2023-02-11 NOTE — ED Provider Triage Note (Signed)
Emergency Medicine Provider Triage Evaluation Note  Susan Griffith , a 57 y.o. female  was evaluated in triage.  Pt complains of right-sided chest pain that radiated to the right elbow.  Associated with some lightheadedness, nausea.  No vomiting.  Chest pain has resolved.  Review of Systems  Positive: As above Negative: As above  Physical Exam  BP (!) 152/100   Pulse 78   Temp 98.3 F (36.8 C)   Resp 16   LMP 02/11/2022 (Approximate)   SpO2 98%  Gen:   Awake, no distress   Resp:  Normal effort  MSK:   Moves extremities without difficulty  Other:    Medical Decision Making  Medically screening exam initiated at 1:47 PM.  Appropriate orders placed.  Susan Griffith was informed that the remainder of the evaluation will be completed by another provider, this initial triage assessment does not replace that evaluation, and the importance of remaining in the ED until their evaluation is complete.     Susan Kansas, PA-C 02/11/23 1349

## 2023-02-11 NOTE — Discharge Instructions (Signed)
A telephone number is below if you would like to establish care with cardiology.  Return to the emergency department for any further symptoms of concern.

## 2023-02-11 NOTE — ED Triage Notes (Signed)
Pt reports R sided chest pain onset around noon when she got home. Has associated nausea and R arm tingling.

## 2023-02-11 NOTE — ED Provider Notes (Signed)
[place] EMERGENCY DEPARTMENT AT [facility] Provider Note   CSN: [id number] Arrival date & time: 02/11/23  1335     History  Chief Complaint  Patient presents with   Chest Pain    Susan Griffith is a 57 y.o. female.   Chest Pain Associated symptoms: nausea   Patient presents for chest pain.  Medical history includes HTN, hypothyroidism, migraine headaches, sarcoidosis.  She does not see a cardiologist.  Today, she was in her normal state of health.  Shortly after lunch, she developed right-sided chest pain.  She had tingling in her right arm and developed some nausea.  Episode lasted for approximately 1 hour.  She has been asymptomatic since that time.     Home Medications Prior to Admission medications   Medication Sig Start Date End Date Taking? Authorizing Provider  albuterol (VENTOLIN HFA) 108 (90 Base) MCG/ACT inhaler Inhale 2 puffs into the lungs every 6 (six) hours as needed for wheezing or shortness of breath. 04/10/20   Lyndon Code, MD  estradiol (VIVELLE-DOT) 0.0375 MG/24HR APPLY 1 PATCH TOPICALLY TO THE SKIN 2 TIMES A WEEK 02/04/23   Lyndon Code, MD  hydrochlorothiazide (HYDRODIURIL) 12.5 MG tablet TAKE 1 TABLET(12.5 MG) BY MOUTH DAILY 07/02/22   Sallyanne Kuster, NP  phentermine 15 MG capsule Take 1 capsule (15 mg total) by mouth every morning. 01/27/23   Lyndon Code, MD  topiramate (TOPAMAX) 25 MG tablet TAKE 1 TABLET BY MOUTH TWICE DAILY 01/28/23   Lyndon Code, MD      Allergies    Keflex [cephalexin] and Latex    Review of Systems   Review of Systems  Cardiovascular:  Positive for chest pain.  Gastrointestinal:  Positive for nausea.  All other systems reviewed and are negative.   Physical Exam Updated Vital Signs BP (!) 153/102   Pulse 68   Temp 98.5 F (36.9 C) (Oral)   Resp 16   Ht 5\' 9"  (1.753 m)   Wt 88.5 kg   LMP 02/11/2022 (Approximate)   SpO2 100%   BMI 28.80 kg/m  Physical Exam Vitals and nursing note  reviewed.  Constitutional:      General: She is not in acute distress.    Appearance: She is well-developed. She is not ill-appearing, toxic-appearing or diaphoretic.  HENT:     Head: Normocephalic and atraumatic.  Eyes:     Conjunctiva/sclera: Conjunctivae normal.  Cardiovascular:     Rate and Rhythm: Normal rate and regular rhythm.  Pulmonary:     Effort: Pulmonary effort is normal. No respiratory distress.  Chest:     Chest wall: No tenderness.  Abdominal:     Palpations: Abdomen is soft.     Tenderness: There is no abdominal tenderness.  Musculoskeletal:        General: No swelling. Normal range of motion.     Cervical back: Normal range of motion and neck supple.     Right lower leg: No edema.     Left lower leg: No edema.  Skin:    General: Skin is warm and dry.     Coloration: Skin is not cyanotic or pale.  Neurological:     General: No focal deficit present.     Mental Status: She is alert and oriented to person, place, and time.  Psychiatric:        Mood and Affect: Mood normal.        Behavior: Behavior normal.     ED Results /  Procedures / Treatments   Labs (all labs ordered are listed, but only abnormal results are displayed) Labs Reviewed  BASIC METABOLIC PANEL  CBC  HEPATIC FUNCTION PANEL  MAGNESIUM  LIPASE, BLOOD  TROPONIN I (HIGH SENSITIVITY)  TROPONIN I (HIGH SENSITIVITY)    EKG EKG Interpretation  Date/Time:  Tuesday Awanda 04 2024 13:49:43 EDT Ventricular Rate:  77 PR Interval:  152 QRS Duration: 78 QT Interval:  372 QTC Calculation: 420 R Axis:   -3 Text Interpretation: Normal sinus rhythm Confirmed by Gloris Manchester [phone]) on 02/11/2023 7:04:57 PM  Radiology US Abdomen Limited  Result Date: 02/11/2023 CLINICAL DATA:  Right upper quadrant pain. EXAM: ULTRASOUND ABDOMEN LIMITED RIGHT UPPER QUADRANT COMPARISON:  None Available. FINDINGS: Gallbladder: No gallstones or wall thickening visualized (2.2 mm). No sonographic Murphy sign noted by  sonographer. Common bile duct: Diameter: 3.1 mm Liver: No focal lesion identified. Within normal limits in parenchymal echogenicity. Portal vein is patent on color Doppler imaging with normal direction of blood flow towards the liver. Other: None. IMPRESSION: Normal right upper quadrant ultrasound. Electronically Signed   By: Aram Candela M.D.   On: 02/11/2023 20:03   DG Chest 2 View  Result Date: 02/11/2023 CLINICAL DATA:  Chest pain. EXAM: CHEST - 2 VIEW COMPARISON:  Chest radiographs 06/07/2013 FINDINGS: Cardiac silhouette and mediastinal contours are within normal limits. Minimal curvilinear subsegmental atelectasis within the inferior left lung. The right lung is clear. No acute skeletal abnormality. IMPRESSION: No active cardiopulmonary disease. Electronically Signed   By: Neita Garnet M.D.   On: 02/11/2023 15:35    Procedures Procedures    Medications Ordered in ED Medications - No data to display  ED Course/ Medical Decision Making/ A&P                             Medical Decision Making Amount and/or Complexity of Data Reviewed Labs: ordered. Radiology: ordered.   This patient presents to the ED for concern of chest pain, this involves an extensive number of treatment options, and is a complaint that carries with it a high risk of complications and morbidity.  The differential diagnosis includes ACS, GERD, cholelithiasis, anxiety   Co morbidities that complicate the patient evaluation  HTN, hypothyroidism, migraine headaches, sarcoidosis   Additional history obtained:  Additional history obtained from patient's daughter External records from outside source obtained and reviewed including EMR   Lab Tests:  I Ordered, and personally interpreted labs.  The pertinent results include: Normal hemoglobin, no leukocytosis, normal hepatobiliary enzymes, normal lipase, normal troponins x 2   Imaging Studies ordered:  I ordered imaging studies including right upper  quadrant ultrasound, chest x-ray I independently visualized and interpreted imaging which showed no acute findings I agree with the radiologist interpretation   Cardiac Monitoring: / EKG:  The patient was maintained on a cardiac monitor.  I personally viewed and interpreted the cardiac monitored which showed an underlying rhythm of: Sinus rhythm  Problem List / ED Course / Critical interventions / Medication management  Patient presenting for episode of chest pain with associated nausea and tingling to right arm.  Prior to being bedded in the ED, workup was initiated.  EKG shows no concerning ST segment abnormalities.  Initial lab work shows normal electrolytes, normal hemoglobin, normal troponin.  On assessment, patient is well-appearing.  She is asymptomatic and has been for the last several hours.  Vital signs are notable for hypertension.  Etiologies of her  episode include ACS, cholelithiasis, anxiety.  She has been under increased stress lately helping her daughter move.  Right upper quadrant ultrasound was ordered to assess for gallstones.  Patient was kept on bedside cardiac monitor.  Repeat troponin was normal.  Ultrasound of abdomen did not show evidence of gallstones.  Patient remained asymptomatic while in the ED.  She is unsure if she would like to establish care with cardiology at this point.  She was provided with contact information for follow-up as needed.  She was discharged in good condition.   Social Determinants of Health:  Has PCP        Final Clinical Impression(s) / ED Diagnoses Final diagnoses:  Chest pain, unspecified type    Rx / DC Orders ED Discharge Orders     None         Gloris Manchester, MD 02/11/23 2035

## 2023-02-14 ENCOUNTER — Encounter: Payer: Self-pay | Admitting: Physician Assistant

## 2023-02-14 ENCOUNTER — Ambulatory Visit: Payer: BC Managed Care – PPO | Admitting: Physician Assistant

## 2023-02-14 VITALS — BP 110/80 | HR 78 | Temp 98.0°F | Resp 16 | Ht 69.0 in | Wt 199.6 lb

## 2023-02-14 DIAGNOSIS — T887XXA Unspecified adverse effect of drug or medicament, initial encounter: Secondary | ICD-10-CM | POA: Diagnosis not present

## 2023-02-14 DIAGNOSIS — Z79890 Hormone replacement therapy (postmenopausal): Secondary | ICD-10-CM

## 2023-02-14 DIAGNOSIS — R079 Chest pain, unspecified: Secondary | ICD-10-CM

## 2023-02-14 NOTE — Progress Notes (Signed)
[facility] [address] [place], [place] [address]  Internal MEDICINE  Office Visit Note  Patient Name: Susan Griffith  [id number]  [id number]  Date of Service: 02/14/2023  Chief Complaint  Patient presents with   Hospitalization Follow-up    Chest pain    HPI Pt is here for ED follow up -Went for episode of sharp chest pain on 02/11/23. Chest hurt on right side and was sharp and took some deep breaths. Then pain got worse and radiated down right arm with numbness and tingling -Called her son who is fire and EMT who then asked her a lot of questions and when she then developed nausea as well he took her to ED. -In ED the EKG was normal, then went ahead and checked RUQ [place] to check GB which looked ok -Had just swtiched to new estrogen patch a few days prior and thinks this was the culprit as when she looked up side effects in the ED  this was the first one listed. States she had done a sample patch prior to this, but this was the first script she had filled and tried. She previously had S/E with estrogen suppository. She is going to stop any form of HRT at this time and try to manage on her own for now given multiple S/E -Does take phentermine occasionally but has been on this before without any CP or S/E  Current Medication: Outpatient Encounter Medications as of 02/14/2023  Medication Sig   albuterol (VENTOLIN HFA) 108 (90 Base) MCG/ACT inhaler Inhale 2 puffs into the lungs every 6 (six) hours as needed for wheezing or shortness of breath.   estradiol (VIVELLE-DOT) 0.0375 MG/24HR APPLY 1 PATCH TOPICALLY TO THE SKIN 2 TIMES A WEEK   hydrochlorothiazide (HYDRODIURIL) 12.5 MG tablet TAKE 1 TABLET(12.5 MG) BY MOUTH DAILY   phentermine 15 MG capsule Take 1 capsule (15 mg total) by mouth every morning.   topiramate (TOPAMAX) 25 MG tablet TAKE 1 TABLET BY MOUTH TWICE DAILY   No facility-administered encounter medications on file as of 02/14/2023.    Surgical  History: Past Surgical History:  Procedure Laterality Date   child birth     natural   thigh surgery  10/22/2018    Medical History: Past Medical History:  Diagnosis Date   Migraines    Sarcoidosis    Thyroid nodule     Family History: Family History  Problem Relation Age of Onset   Hyperlipidemia Mother    Hypertension Mother     Social History   Socioeconomic History   Marital status: Married    Spouse name: Not on file   Number of children: Not on file   Years of education: Not on file   Highest education level: Not on file  Occupational History   Not on file  Tobacco Use   Smoking status: Never   Smokeless tobacco: Never  Substance and Sexual Activity   Alcohol use: Yes    Comment: ocassionally   Drug use: Never   Sexual activity: Not on file  Other Topics Concern   Not on file  Social History Narrative   Not on file   Social Determinants of Health   Financial Resource Strain: Not on file  Food Insecurity: Not on file  Transportation Needs: Not on file  Physical Activity: Not on file  Stress: Not on file  Social Connections: Not on file  Intimate Partner Violence: Not on file      Review of Systems  Constitutional:  Negative for chills, fatigue and unexpected weight change.  HENT:  Positive for postnasal drip. Negative for congestion, rhinorrhea, sneezing and sore throat.   Eyes:  Negative for redness.  Respiratory:  Negative for cough, chest tightness and shortness of breath.   Cardiovascular:  Negative for chest pain and palpitations.  Gastrointestinal:  Negative for abdominal pain, constipation, diarrhea, nausea and vomiting.  Genitourinary:  Negative for dysuria and frequency.  Musculoskeletal:  Negative for arthralgias, back pain, joint swelling and neck pain.  Skin:  Negative for rash.  Neurological: Negative.  Negative for tremors and numbness.  Hematological:  Negative for adenopathy. Does not bruise/bleed easily.   Psychiatric/Behavioral:  Negative for behavioral problems (Depression), sleep disturbance and suicidal ideas. The patient is not nervous/anxious.     Vital Signs: BP 110/80   Pulse 78   Temp 98 F (36.7 C)   Resp 16   Ht 5\' 9"  (1.753 m)   Wt 199 lb 9.6 oz (90.5 kg)   LMP 02/11/2022 (Approximate)   SpO2 97%   BMI 29.48 kg/m    Physical Exam Constitutional:      Appearance: Normal appearance.  HENT:     Head: Normocephalic and atraumatic.     Nose: Nose normal.     Mouth/Throat:     Mouth: Mucous membranes are moist.     Pharynx: No posterior oropharyngeal erythema.  Eyes:     Extraocular Movements: Extraocular movements intact.     Pupils: Pupils are equal, round, and reactive to light.  Cardiovascular:     Rate and Rhythm: Normal rate and regular rhythm.     Pulses: Normal pulses.     Heart sounds: Normal heart sounds.  Pulmonary:     Effort: Pulmonary effort is normal.     Breath sounds: Normal breath sounds.  Skin:    General: Skin is warm and dry.  Neurological:     General: No focal deficit present.     Mental Status: She is alert.  Psychiatric:        Mood and Affect: Mood normal.        Behavior: Behavior normal.        Assessment/Plan: 1. Medication side effect CP possibly secondary to new estrogen patch as work up was otherwise reassuring in ED and symptoms have resolved without recurrence. D/c patch and patient will monitor symptoms and seek care if any recurrence  2. Hormone replacement therapy (postmenopausal) Will d/c patch due to possible side effects, pt prefers to manage on her own for now  3. Chest pain, unspecified type Resolved, monitor symptoms and seek care if any recurrence   General Counseling: Susan Griffith verbalizes understanding of the findings of todays visit and agrees with plan of treatment. I have discussed any further diagnostic evaluation that may be needed or ordered today. We also reviewed her medications today. she has been  encouraged to call the office with any questions or concerns that should arise related to todays visit.    No orders of the defined types were placed in this encounter.   No orders of the defined types were placed in this encounter.   This patient was seen by Lynn Ito, PA-C in collaboration with Dr. Beverely Risen as a part of collaborative care agreement.   Total time spent:30 Minutes Time spent includes review of chart, medications, test results, and follow up plan with the patient.      Dr Lyndon Code Internal medicine

## 2023-03-04 ENCOUNTER — Encounter: Payer: Self-pay | Admitting: Nurse Practitioner

## 2023-03-04 ENCOUNTER — Ambulatory Visit: Payer: BC Managed Care – PPO | Admitting: Nurse Practitioner

## 2023-03-04 VITALS — BP 120/78 | HR 85 | Temp 98.3°F | Resp 16 | Ht 69.0 in | Wt 200.2 lb

## 2023-03-04 DIAGNOSIS — E663 Overweight: Secondary | ICD-10-CM

## 2023-03-04 DIAGNOSIS — Z6829 Body mass index (BMI) 29.0-29.9, adult: Secondary | ICD-10-CM | POA: Diagnosis not present

## 2023-03-04 DIAGNOSIS — Z79890 Hormone replacement therapy (postmenopausal): Secondary | ICD-10-CM | POA: Diagnosis not present

## 2023-03-04 DIAGNOSIS — I10 Essential (primary) hypertension: Secondary | ICD-10-CM

## 2023-03-04 MED ORDER — PHENTERMINE HCL 37.5 MG PO TABS
37.5000 mg | ORAL_TABLET | Freq: Every day | ORAL | 1 refills | Status: DC
Start: 2023-03-04 — End: 2023-04-29

## 2023-03-04 NOTE — Progress Notes (Signed)
[facility] [address] [place], [place] [address]  Internal MEDICINE  Office Visit Note  Patient Name: Susan Griffith  [id number]  [id number]  Date of Service: 03/04/2023  Chief Complaint  Patient presents with   Follow-up    HPI Jalisha presents for a follow-up visit for  Stopped patch, felt like she was having heart attack. Can mimic a heart attack, took patch off.  Weight loss, increase phantermine     Current Medication: Outpatient Encounter Medications as of 03/04/2023  Medication Sig   albuterol (VENTOLIN HFA) 108 (90 Base) MCG/ACT inhaler Inhale 2 puffs into the lungs every 6 (six) hours as needed for wheezing or shortness of breath.   hydrochlorothiazide (HYDRODIURIL) 12.5 MG tablet TAKE 1 TABLET(12.5 MG) BY MOUTH DAILY   phentermine (ADIPEX-P) 37.5 MG tablet Take 1 tablet (37.5 mg total) by mouth daily before breakfast.   topiramate (TOPAMAX) 25 MG tablet TAKE 1 TABLET BY MOUTH TWICE DAILY   [DISCONTINUED] estradiol (VIVELLE-DOT) 0.0375 MG/24HR APPLY 1 PATCH TOPICALLY TO THE SKIN 2 TIMES A WEEK   [DISCONTINUED] phentermine 15 MG capsule Take 1 capsule (15 mg total) by mouth every morning.   No facility-administered encounter medications on file as of 03/04/2023.    Surgical History: Past Surgical History:  Procedure Laterality Date   child birth     natural   thigh surgery  10/22/2018    Medical History: Past Medical History:  Diagnosis Date   Migraines    Sarcoidosis    Thyroid nodule     Family History: Family History  Problem Relation Age of Onset   Hyperlipidemia Mother    Hypertension Mother     Social History   Socioeconomic History   Marital status: Married    Spouse name: Not on file   Number of children: Not on file   Years of education: Not on file   Highest education level: Not on file  Occupational History   Not on file  Tobacco Use   Smoking status: Never   Smokeless tobacco: Never  Substance and  Sexual Activity   Alcohol use: Yes    Comment: ocassionally   Drug use: Never   Sexual activity: Not on file  Other Topics Concern   Not on file  Social History Narrative   Not on file   Social Determinants of Health   Financial Resource Strain: Not on file  Food Insecurity: Not on file  Transportation Needs: Not on file  Physical Activity: Not on file  Stress: Not on file  Social Connections: Not on file  Intimate Partner Violence: Not on file      Review of Systems  Vital Signs: BP 120/78   Pulse 85   Temp 98.3 F (36.8 C)   Resp 16   Ht 5\' 9"  (1.753 m)   Wt 200 lb 3.2 oz (90.8 kg)   LMP 02/11/2022 (Approximate)   SpO2 99%   BMI 29.56 kg/m    Physical Exam     Assessment/Plan:   General Counseling: Ishanvi verbalizes understanding of the findings of todays visit and agrees with plan of treatment. I have discussed any further diagnostic evaluation that may be needed or ordered today. We also reviewed her medications today. she has been encouraged to call the office with any questions or concerns that should arise related to todays visit.    No orders of the defined types were placed in this encounter.   Meds ordered this encounter  Medications   phentermine (  ADIPEX-P) 37.5 MG tablet    Sig: Take 1 tablet (37.5 mg total) by mouth daily before breakfast.    Dispense:  30 tablet    Refill:  1    Note increased dose, please fill new script now.    Return in about 8 weeks (around 04/29/2023) for F/U, Weight loss with Dura Mccormack or DFK. .   Total time spent:*** Minutes Time spent includes review of chart, medications, test results, and follow up plan with the patient.   [place] Controlled Substance Database was reviewed by me.  This patient was seen by Sallyanne Kuster, FNP-C in collaboration with Dr. Beverely Risen as a part of collaborative care agreement.   Kelden Lavallee R. Tedd Sias, MSN, FNP-C Internal medicine

## 2023-03-05 ENCOUNTER — Encounter: Payer: Self-pay | Admitting: Nurse Practitioner

## 2023-04-01 DIAGNOSIS — I8390 Asymptomatic varicose veins of unspecified lower extremity: Secondary | ICD-10-CM | POA: Diagnosis not present

## 2023-04-01 DIAGNOSIS — I8392 Asymptomatic varicose veins of left lower extremity: Secondary | ICD-10-CM | POA: Diagnosis not present

## 2023-04-29 ENCOUNTER — Encounter: Payer: Self-pay | Admitting: Nurse Practitioner

## 2023-04-29 ENCOUNTER — Ambulatory Visit: Payer: BC Managed Care – PPO | Admitting: Nurse Practitioner

## 2023-04-29 VITALS — BP 133/80 | HR 76 | Temp 97.6°F | Resp 16 | Ht 69.0 in | Wt 201.6 lb

## 2023-04-29 DIAGNOSIS — E039 Hypothyroidism, unspecified: Secondary | ICD-10-CM

## 2023-04-29 DIAGNOSIS — Z1231 Encounter for screening mammogram for malignant neoplasm of breast: Secondary | ICD-10-CM | POA: Diagnosis not present

## 2023-04-29 DIAGNOSIS — E782 Mixed hyperlipidemia: Secondary | ICD-10-CM

## 2023-04-29 DIAGNOSIS — Z6829 Body mass index (BMI) 29.0-29.9, adult: Secondary | ICD-10-CM

## 2023-04-29 DIAGNOSIS — E663 Overweight: Secondary | ICD-10-CM

## 2023-04-29 DIAGNOSIS — G43809 Other migraine, not intractable, without status migrainosus: Secondary | ICD-10-CM | POA: Diagnosis not present

## 2023-04-29 DIAGNOSIS — Z0001 Encounter for general adult medical examination with abnormal findings: Secondary | ICD-10-CM | POA: Diagnosis not present

## 2023-04-29 DIAGNOSIS — E559 Vitamin D deficiency, unspecified: Secondary | ICD-10-CM

## 2023-04-29 DIAGNOSIS — R030 Elevated blood-pressure reading, without diagnosis of hypertension: Secondary | ICD-10-CM

## 2023-04-29 DIAGNOSIS — D869 Sarcoidosis, unspecified: Secondary | ICD-10-CM

## 2023-04-29 MED ORDER — TOPIRAMATE 25 MG PO TABS
ORAL_TABLET | ORAL | 1 refills | Status: DC
Start: 2023-04-29 — End: 2023-07-27

## 2023-04-29 MED ORDER — HYDROCHLOROTHIAZIDE 12.5 MG PO TABS: 12.5000 mg | ORAL_TABLET | Freq: Every day | ORAL | 3 refills | Status: DC

## 2023-04-29 MED ORDER — ALBUTEROL SULFATE HFA 108 (90 BASE) MCG/ACT IN AERS
2.0000 | INHALATION_SPRAY | Freq: Four times a day (QID) | RESPIRATORY_TRACT | 2 refills | Status: DC | PRN
Start: 2023-04-29 — End: 2023-12-31

## 2023-04-29 MED ORDER — PHENTERMINE HCL 37.5 MG PO TABS
37.5000 mg | ORAL_TABLET | Freq: Every day | ORAL | 1 refills | Status: DC
Start: 2023-04-29 — End: 2023-05-23

## 2023-04-29 NOTE — Progress Notes (Signed)
[facility] [address] [place], [place] [address]  Internal MEDICINE  Office Visit Note  Patient Name: Susan Griffith  [id number]  [id number]  Date of Service: 04/29/2023  Chief Complaint  Patient presents with   Follow-up    Weight loss    HPI Susan Griffith presents for an annual well visit and physical exam.  Well-appearing 57 y.o. female with hypertension, hypothyroidism, mild asthma, and sarcoidosis.  Routine CRC screening: cologuard done in 2021, due again this year or opt for colonoscopy instead Routine mammogram: due now Pap smear: due between 2024 and 2026 per patient preference.  Labs: routine labs are due  New or worsening pain: none  Other concerns: weight loss, wants to continue with phentermine and topiramate and follow up in 8 weeks. Due for routine refills as well.     Current Medication: Outpatient Encounter Medications as of 04/29/2023  Medication Sig   [DISCONTINUED] albuterol (VENTOLIN HFA) 108 (90 Base) MCG/ACT inhaler Inhale 2 puffs into the lungs every 6 (six) hours as needed for wheezing or shortness of breath.   [DISCONTINUED] hydrochlorothiazide (HYDRODIURIL) 12.5 MG tablet TAKE 1 TABLET(12.5 MG) BY MOUTH DAILY   [DISCONTINUED] phentermine (ADIPEX-P) 37.5 MG tablet Take 1 tablet (37.5 mg total) by mouth daily before breakfast.   [DISCONTINUED] topiramate (TOPAMAX) 25 MG tablet TAKE 1 TABLET BY MOUTH TWICE DAILY   albuterol (VENTOLIN HFA) 108 (90 Base) MCG/ACT inhaler Inhale 2 puffs into the lungs every 6 (six) hours as needed for wheezing or shortness of breath.   hydrochlorothiazide (HYDRODIURIL) 12.5 MG tablet Take 1 tablet (12.5 mg total) by mouth daily.   phentermine (ADIPEX-P) 37.5 MG tablet Take 1 tablet (37.5 mg total) by mouth daily before breakfast.   topiramate (TOPAMAX) 25 MG tablet TAKE 1 TABLET BY MOUTH TWICE DAILY   No facility-administered encounter medications on file as of 04/29/2023.    Surgical History: Past  Surgical History:  Procedure Laterality Date   child birth     natural   thigh surgery  10/22/2018    Medical History: Past Medical History:  Diagnosis Date   Migraines    Sarcoidosis    Thyroid nodule     Family History: Family History  Problem Relation Age of Onset   Hyperlipidemia Mother    Hypertension Mother     Social History   Socioeconomic History   Marital status: Married    Spouse name: Not on file   Number of children: Not on file   Years of education: Not on file   Highest education level: Not on file  Occupational History   Not on file  Tobacco Use   Smoking status: Never   Smokeless tobacco: Never  Substance and Sexual Activity   Alcohol use: Yes    Comment: ocassionally   Drug use: Never   Sexual activity: Not on file  Other Topics Concern   Not on file  Social History Narrative   Not on file   Social Determinants of Health   Financial Resource Strain: Not on file  Food Insecurity: Not on file  Transportation Needs: Not on file  Physical Activity: Not on file  Stress: Not on file  Social Connections: Not on file  Intimate Partner Violence: Not on file      Review of Systems  Constitutional:  Positive for appetite change, fatigue and unexpected weight change. Negative for activity change, chills and fever.  HENT: Negative.  Negative for congestion, ear pain, rhinorrhea, sore throat and trouble swallowing.  Eyes: Negative.   Respiratory: Negative.  Negative for cough, chest tightness, shortness of breath and wheezing.   Cardiovascular: Negative.  Negative for chest pain.  Gastrointestinal: Negative.  Negative for abdominal pain, blood in stool, constipation, diarrhea, nausea and vomiting.  Endocrine: Negative.   Genitourinary: Negative.  Negative for difficulty urinating, dysuria, frequency, hematuria and urgency.  Musculoskeletal: Negative.  Negative for arthralgias, back pain, joint swelling, myalgias and neck pain.  Skin: Negative.   Negative for rash and wound.  Allergic/Immunologic: Negative.  Negative for immunocompromised state.  Neurological: Negative.  Negative for dizziness, seizures, numbness and headaches.  Hematological: Negative.   Psychiatric/Behavioral: Negative.  Negative for behavioral problems, self-injury and suicidal ideas. The patient is not nervous/anxious.     Vital Signs: BP 133/80   Pulse 76   Temp 97.6 F (36.4 C)   Resp 16   Ht 5\' 9"  (1.753 m)   Wt 201 lb 9.6 oz (91.4 kg)   LMP 02/11/2022 (Approximate)   SpO2 98%   BMI 29.77 kg/m    Physical Exam Vitals reviewed.  Constitutional:      General: She is not in acute distress.    Appearance: Normal appearance. She is well-developed. She is not ill-appearing or diaphoretic.  HENT:     Head: Normocephalic and atraumatic.     Right Ear: Tympanic membrane, ear canal and external ear normal.     Left Ear: Tympanic membrane, ear canal and external ear normal.     Nose: Nose normal. No congestion or rhinorrhea.     Mouth/Throat:     Mouth: Mucous membranes are moist.     Pharynx: Oropharynx is clear. No oropharyngeal exudate or posterior oropharyngeal erythema.  Eyes:     General: No scleral icterus.       Right eye: No discharge.        Left eye: No discharge.     Extraocular Movements: Extraocular movements intact.     Conjunctiva/sclera: Conjunctivae normal.     Pupils: Pupils are equal, round, and reactive to light.  Neck:     Thyroid: No thyromegaly.     Vascular: No JVD.     Trachea: No tracheal deviation.  Cardiovascular:     Rate and Rhythm: Normal rate and regular rhythm.     Pulses: Normal pulses.     Heart sounds: Normal heart sounds. No murmur heard.    No friction rub. No gallop.  Pulmonary:     Effort: Pulmonary effort is normal. No respiratory distress.     Breath sounds: Normal breath sounds. No stridor. No wheezing or rales.  Chest:     Chest wall: No tenderness.  Abdominal:     General: Bowel sounds are  normal. There is no distension.     Palpations: Abdomen is soft. There is no mass.     Tenderness: There is no abdominal tenderness. There is no guarding or rebound.  Musculoskeletal:        General: No tenderness or deformity. Normal range of motion.     Cervical back: Normal range of motion and neck supple.  Lymphadenopathy:     Cervical: No cervical adenopathy.  Skin:    General: Skin is warm and dry.     Capillary Refill: Capillary refill takes less than 2 seconds.     Coloration: Skin is not pale.     Findings: No erythema or rash.  Neurological:     Mental Status: She is alert and oriented to person, place, and time.  Cranial Nerves: No cranial nerve deficit.     Motor: No abnormal muscle tone.     Coordination: Coordination normal.     Deep Tendon Reflexes: Reflexes are normal and symmetric.  Psychiatric:        Behavior: Behavior normal.        Thought Content: Thought content normal.        Judgment: Judgment normal.        Assessment/Plan: 1. Encounter for routine adult health examination with abnormal findings Age-appropriate preventive screenings and vaccinations discussed, annual physical exam completed. Routine labs for health maintenance ordered, see below. Routine refills ordered. PHM updated.  - TSH + free T4 - Vitamin D (25 hydroxy) - Lipid Profile - hydrochlorothiazide (HYDRODIURIL) 12.5 MG tablet; Take 1 tablet (12.5 mg total) by mouth daily.  Dispense: 90 tablet; Refill: 3 - albuterol (VENTOLIN HFA) 108 (90 Base) MCG/ACT inhaler; Inhale 2 puffs into the lungs every 6 (six) hours as needed for wheezing or shortness of breath.  Dispense: 18 g; Refill: 2  2. Sarcoidosis Routine labs ordered  - TSH + free T4 - Vitamin D (25 hydroxy) - Lipid Profile  3. Acquired hypothyroidism Routine labs ordered. Not currently on any medication, just monitoring levels.  - TSH + free T4 - Vitamin D (25 hydroxy) - Lipid Profile  4. Other migraine without status  migrainosus, not intractable Topiramate prescribed for weight loss aide but will help with migraine prevention as well. Routine labs ordered  - TSH + free T4 - Vitamin D (25 hydroxy) - Lipid Profile - topiramate (TOPAMAX) 25 MG tablet; TAKE 1 TABLET BY MOUTH TWICE DAILY  Dispense: 180 tablet; Refill: 1  5. Mixed hyperlipidemia Routine labs ordered  - TSH + free T4 - Vitamin D (25 hydroxy) - Lipid Profile  6. Vitamin D deficiency Routine labs ordered  - TSH + free T4 - Vitamin D (25 hydroxy) - Lipid Profile  7. Overweight with body mass index (BMI) of 29 to 29.9 in adult Continue phentermine and topiramate as prescribed, routine labs ordered, have them drawn prior to next office visit.  - TSH + free T4 - Vitamin D (25 hydroxy) - Lipid Profile - phentermine (ADIPEX-P) 37.5 MG tablet; Take 1 tablet (37.5 mg total) by mouth daily before breakfast.  Dispense: 30 tablet; Refill: 1 - topiramate (TOPAMAX) 25 MG tablet; TAKE 1 TABLET BY MOUTH TWICE DAILY  Dispense: 180 tablet; Refill: 1  8. Encounter for screening mammogram for malignant neoplasm of breast Routine mammogram ordered  - MM 3D SCREENING MAMMOGRAM BILATERAL BREAST; Future    General Counseling: Susan Griffith verbalizes understanding of the findings of todays visit and agrees with plan of treatment. I have discussed any further diagnostic evaluation that may be needed or ordered today. We also reviewed her medications today. she has been encouraged to call the office with any questions or concerns that should arise related to todays visit.    Orders Placed This Encounter  Procedures   MM 3D SCREENING MAMMOGRAM BILATERAL BREAST   TSH + free T4   Vitamin D (25 hydroxy)   Lipid Profile    Meds ordered this encounter  Medications   hydrochlorothiazide (HYDRODIURIL) 12.5 MG tablet    Sig: Take 1 tablet (12.5 mg total) by mouth daily.    Dispense:  90 tablet    Refill:  3   albuterol (VENTOLIN HFA) 108 (90 Base) MCG/ACT inhaler     Sig: Inhale 2 puffs into the lungs every 6 (six) hours as  needed for wheezing or shortness of breath.    Dispense:  18 g    Refill:  2   phentermine (ADIPEX-P) 37.5 MG tablet    Sig: Take 1 tablet (37.5 mg total) by mouth daily before breakfast.    Dispense:  30 tablet    Refill:  1    Note increased dose, please fill new script now.   topiramate (TOPAMAX) 25 MG tablet    Sig: TAKE 1 TABLET BY MOUTH TWICE DAILY    Dispense:  180 tablet    Refill:  1    Return in about 11 weeks (around 07/15/2023) for F/U, Weight loss, Labs, Deontez Klinke PCP.   Total time spent:30 Minutes Time spent includes review of chart, medications, test results, and follow up plan with the patient.   [place] Controlled Substance Database was reviewed by me.  This patient was seen by Sallyanne Kuster, FNP-C in collaboration with Dr. Beverely Risen as a part of collaborative care agreement.  Rivers Gassmann R. Tedd Sias, MSN, FNP-C Internal medicine

## 2023-04-30 ENCOUNTER — Encounter: Payer: Self-pay | Admitting: Nurse Practitioner

## 2023-05-22 ENCOUNTER — Telehealth: Payer: Self-pay | Admitting: Nurse Practitioner

## 2023-05-22 DIAGNOSIS — Z6829 Body mass index (BMI) 29.0-29.9, adult: Secondary | ICD-10-CM

## 2023-05-23 MED ORDER — PHENTERMINE HCL 37.5 MG PO TABS
37.5000 mg | ORAL_TABLET | Freq: Every day | ORAL | 0 refills | Status: DC
Start: 2023-05-23 — End: 2023-06-17

## 2023-05-23 NOTE — Telephone Encounter (Signed)
Done

## 2023-05-27 ENCOUNTER — Encounter: Payer: BC Managed Care – PPO | Admitting: Nurse Practitioner

## 2023-06-10 DIAGNOSIS — M7989 Other specified soft tissue disorders: Secondary | ICD-10-CM | POA: Diagnosis not present

## 2023-06-10 DIAGNOSIS — I83813 Varicose veins of bilateral lower extremities with pain: Secondary | ICD-10-CM | POA: Diagnosis not present

## 2023-06-17 ENCOUNTER — Ambulatory Visit (INDEPENDENT_AMBULATORY_CARE_PROVIDER_SITE_OTHER): Payer: BC Managed Care – PPO | Admitting: Nurse Practitioner

## 2023-06-17 ENCOUNTER — Encounter: Payer: Self-pay | Admitting: Nurse Practitioner

## 2023-06-17 VITALS — BP 114/78 | HR 83 | Temp 97.4°F | Resp 16 | Ht 69.0 in | Wt 201.0 lb

## 2023-06-17 DIAGNOSIS — E663 Overweight: Secondary | ICD-10-CM | POA: Diagnosis not present

## 2023-06-17 DIAGNOSIS — Z1211 Encounter for screening for malignant neoplasm of colon: Secondary | ICD-10-CM

## 2023-06-17 DIAGNOSIS — E039 Hypothyroidism, unspecified: Secondary | ICD-10-CM | POA: Diagnosis not present

## 2023-06-17 DIAGNOSIS — I83813 Varicose veins of bilateral lower extremities with pain: Secondary | ICD-10-CM | POA: Diagnosis not present

## 2023-06-17 DIAGNOSIS — Z6829 Body mass index (BMI) 29.0-29.9, adult: Secondary | ICD-10-CM

## 2023-06-17 DIAGNOSIS — Z1212 Encounter for screening for malignant neoplasm of rectum: Secondary | ICD-10-CM

## 2023-06-17 MED ORDER — PHENTERMINE HCL 37.5 MG PO TABS
37.5000 mg | ORAL_TABLET | Freq: Every day | ORAL | 0 refills | Status: DC
Start: 2023-06-17 — End: 2023-07-29

## 2023-06-17 NOTE — Progress Notes (Signed)
[facility] [address] [place], [place] [address]  Internal MEDICINE  Office Visit Note  Patient Name: Susan Griffith  [id number]  [id number]  Date of Service: 06/17/2023  Chief Complaint  Patient presents with   Colon Cancer Screening   routine health maintenance    HPI Susan Griffith presents for routine follow up and to discuss routine screenings.  Well-appearing 57 y.o. female with hypertension, hypothyroidism, mild asthma, and sarcoidosis.  Routine CRC screening: due now, opt for cologuard  Routine mammogram: imaging ordered, due in January  Pap smear: due 2026 Labs: labs previously ordered and will be done later  New or worsening pain:pain in legs, waiting for vascular to get with insurance to see if they will pay for the treatment.  Home scale weight 201 lbs.    Current Medication: Outpatient Encounter Medications as of 06/17/2023  Medication Sig   albuterol (VENTOLIN HFA) 108 (90 Base) MCG/ACT inhaler Inhale 2 puffs into the lungs every 6 (six) hours as needed for wheezing or shortness of breath.   hydrochlorothiazide (HYDRODIURIL) 12.5 MG tablet Take 1 tablet (12.5 mg total) by mouth daily.   [DISCONTINUED] phentermine (ADIPEX-P) 37.5 MG tablet Take 1 tablet (37.5 mg total) by mouth daily before breakfast.   [DISCONTINUED] topiramate (TOPAMAX) 25 MG tablet TAKE 1 TABLET BY MOUTH TWICE DAILY   [DISCONTINUED] phentermine (ADIPEX-P) 37.5 MG tablet Take 1 tablet (37.5 mg total) by mouth daily before breakfast.   No facility-administered encounter medications on file as of 06/17/2023.    Surgical History: Past Surgical History:  Procedure Laterality Date   child birth     natural   thigh surgery  10/22/2018    Medical History: Past Medical History:  Diagnosis Date   Migraines    Sarcoidosis    Thyroid nodule     Family History: Family History  Problem Relation Age of Onset   Hyperlipidemia Mother    Hypertension Mother     Social  History   Socioeconomic History   Marital status: Married    Spouse name: Not on file   Number of children: Not on file   Years of education: Not on file   Highest education level: Not on file  Occupational History   Not on file  Tobacco Use   Smoking status: Never   Smokeless tobacco: Never  Substance and Sexual Activity   Alcohol use: Yes    Comment: ocassionally   Drug use: Never   Sexual activity: Not on file  Other Topics Concern   Not on file  Social History Narrative   Not on file   Social Determinants of Health   Financial Resource Strain: Not on file  Food Insecurity: Not on file  Transportation Needs: Not on file  Physical Activity: Not on file  Stress: Not on file  Social Connections: Not on file  Intimate Partner Violence: Not on file      Review of Systems  Constitutional:  Positive for appetite change, fatigue and unexpected weight change. Negative for activity change, chills and fever.  HENT: Negative.  Negative for congestion, ear pain, rhinorrhea, sore throat and trouble swallowing.   Eyes: Negative.   Respiratory: Negative.  Negative for cough, chest tightness, shortness of breath and wheezing.   Cardiovascular: Negative.  Negative for chest pain.  Gastrointestinal: Negative.  Negative for abdominal pain, blood in stool, constipation, diarrhea, nausea and vomiting.  Endocrine: Negative.   Genitourinary: Negative.  Negative for difficulty urinating, dysuria, frequency, hematuria and urgency.  Musculoskeletal:  Negative.  Negative for arthralgias, back pain, joint swelling, myalgias and neck pain.  Skin: Negative.  Negative for rash and wound.  Allergic/Immunologic: Negative.  Negative for immunocompromised state.  Neurological: Negative.  Negative for dizziness, seizures, numbness and headaches.  Hematological: Negative.   Psychiatric/Behavioral: Negative.  Negative for behavioral problems, self-injury and suicidal ideas. The patient is not  nervous/anxious.     Vital Signs: BP 114/78   Pulse 83   Temp (!) 97.4 F (36.3 C)   Resp 16   Ht 5\' 9"  (1.753 m)   Wt 201 lb (91.2 kg) Comment: 204 lbs on clinic scale  LMP 02/11/2022 (Approximate)   SpO2 97%   BMI 29.68 kg/m    Physical Exam Vitals reviewed.  Constitutional:      General: She is not in acute distress.    Appearance: Normal appearance. She is well-developed. She is not ill-appearing or diaphoretic.  HENT:     Head: Normocephalic and atraumatic.     Right Ear: Ear canal and external ear normal. There is impacted cerumen.     Left Ear: Ear canal and external ear normal. There is impacted cerumen.     Nose: Nose normal. No congestion or rhinorrhea.     Mouth/Throat:     Mouth: Mucous membranes are moist.     Pharynx: Oropharynx is clear. No oropharyngeal exudate or posterior oropharyngeal erythema.  Eyes:     General: No scleral icterus.       Right eye: No discharge.        Left eye: No discharge.     Extraocular Movements: Extraocular movements intact.     Conjunctiva/sclera: Conjunctivae normal.     Pupils: Pupils are equal, round, and reactive to light.  Neck:     Thyroid: No thyromegaly.     Vascular: No JVD.     Trachea: No tracheal deviation.  Cardiovascular:     Rate and Rhythm: Normal rate and regular rhythm.     Pulses: Normal pulses.     Heart sounds: Normal heart sounds. No murmur heard.    No friction rub. No gallop.  Pulmonary:     Effort: Pulmonary effort is normal. No respiratory distress.     Breath sounds: Normal breath sounds. No stridor. No wheezing or rales.  Chest:     Chest wall: No tenderness.  Abdominal:     General: Bowel sounds are normal. There is no distension.     Palpations: Abdomen is soft. There is no mass.     Tenderness: There is no abdominal tenderness. There is no guarding or rebound.  Musculoskeletal:        General: No tenderness or deformity. Normal range of motion.     Cervical back: Normal range of  motion and neck supple.  Lymphadenopathy:     Cervical: No cervical adenopathy.  Skin:    General: Skin is warm and dry.     Capillary Refill: Capillary refill takes less than 2 seconds.     Coloration: Skin is not pale.     Findings: No erythema or rash.  Neurological:     Mental Status: She is alert and oriented to person, place, and time.     Cranial Nerves: No cranial nerve deficit.     Motor: No abnormal muscle tone.     Coordination: Coordination normal.     Deep Tendon Reflexes: Reflexes are normal and symmetric.  Psychiatric:        Behavior: Behavior normal.  Thought Content: Thought content normal.        Judgment: Judgment normal.        Assessment/Plan: 1. Varicose veins of bilateral lower extremities with pain Waiting to hear from vascular surgery and insurance for approval for procedure   2. Acquired hypothyroidism Continue levothyroxine as prescribed   3. Overweight with body mass index (BMI) of 29 to 29.9 in adult Continue phentermine as prescribed, follow up in 4 weeks   4. Screening for colorectal cancer Cologuard test ordered  - Cologuard      General Counseling: Layaan verbalizes understanding of the findings of todays visit and agrees with plan of treatment. I have discussed any further diagnostic evaluation that may be needed or ordered today. We also reviewed her medications today. she has been encouraged to call the office with any questions or concerns that should arise related to todays visit.    Orders Placed This Encounter  Procedures   Cologuard    Meds ordered this encounter  Medications   DISCONTD: phentermine (ADIPEX-P) 37.5 MG tablet    Sig: Take 1 tablet (37.5 mg total) by mouth daily before breakfast.    Dispense:  30 tablet    Refill:  0    refill    Return in about 1 month (around 07/18/2023) for F/U, Weight loss, Aasir Daigler PCP.   Total time spent:30 Minutes Time spent includes review of chart, medications, test  results, and follow up plan with the patient.   [place] Controlled Substance Database was reviewed by me.  This patient was seen by Sallyanne Kuster, FNP-C in collaboration with Dr. Beverely Risen as a part of collaborative care agreement.  Shaneequa Bahner R. Tedd Sias, MSN, FNP-C Internal medicine

## 2023-06-27 DIAGNOSIS — Z1211 Encounter for screening for malignant neoplasm of colon: Secondary | ICD-10-CM | POA: Diagnosis not present

## 2023-06-27 DIAGNOSIS — Z1212 Encounter for screening for malignant neoplasm of rectum: Secondary | ICD-10-CM | POA: Diagnosis not present

## 2023-07-03 LAB — COLOGUARD: COLOGUARD: NEGATIVE

## 2023-07-11 DIAGNOSIS — E782 Mixed hyperlipidemia: Secondary | ICD-10-CM | POA: Diagnosis not present

## 2023-07-11 DIAGNOSIS — E039 Hypothyroidism, unspecified: Secondary | ICD-10-CM | POA: Diagnosis not present

## 2023-07-11 DIAGNOSIS — E559 Vitamin D deficiency, unspecified: Secondary | ICD-10-CM | POA: Diagnosis not present

## 2023-07-11 DIAGNOSIS — R030 Elevated blood-pressure reading, without diagnosis of hypertension: Secondary | ICD-10-CM | POA: Diagnosis not present

## 2023-07-11 DIAGNOSIS — E663 Overweight: Secondary | ICD-10-CM | POA: Diagnosis not present

## 2023-07-11 DIAGNOSIS — D869 Sarcoidosis, unspecified: Secondary | ICD-10-CM | POA: Diagnosis not present

## 2023-07-12 LAB — LIPID PANEL
Chol/HDL Ratio: 3.3 {ratio} (ref 0.0–4.4)
Cholesterol, Total: 196 mg/dL (ref 100–199)
HDL: 59 mg/dL (ref >39)
LDL Chol Calc (NIH): 108 mg/dL — ABNORMAL HIGH (ref 0–99)
Triglycerides: 165 mg/dL — ABNORMAL HIGH (ref 0–149)
VLDL Cholesterol Cal: 29 mg/dL (ref 5–40)

## 2023-07-12 LAB — TSH+FREE T4
Free T4: 1.22 ng/dL (ref 0.82–1.77)
TSH: 2.34 u[IU]/mL (ref 0.450–4.500)

## 2023-07-12 LAB — VITAMIN D 25 HYDROXY (VIT D DEFICIENCY, FRACTURES): Vit D, 25-Hydroxy: 45.3 ng/mL (ref 30.0–100.0)

## 2023-07-15 ENCOUNTER — Ambulatory Visit: Payer: BC Managed Care – PPO | Admitting: Nurse Practitioner

## 2023-07-18 ENCOUNTER — Telehealth: Payer: Self-pay

## 2023-07-18 NOTE — Progress Notes (Signed)
Negative cologuard,  repeat in 3 years

## 2023-07-18 NOTE — Telephone Encounter (Signed)
Patient notified

## 2023-07-18 NOTE — Telephone Encounter (Signed)
-----   Message from [facility] sent at 07/18/2023  5:15 AM EST ----- Negative cologuard, repeat in 3 years

## 2023-07-21 ENCOUNTER — Ambulatory Visit: Payer: BC Managed Care – PPO | Admitting: Internal Medicine

## 2023-07-27 ENCOUNTER — Other Ambulatory Visit: Payer: Self-pay | Admitting: Internal Medicine

## 2023-07-27 DIAGNOSIS — Z6829 Body mass index (BMI) 29.0-29.9, adult: Secondary | ICD-10-CM

## 2023-07-27 DIAGNOSIS — G43809 Other migraine, not intractable, without status migrainosus: Secondary | ICD-10-CM

## 2023-07-29 ENCOUNTER — Ambulatory Visit: Payer: BC Managed Care – PPO | Admitting: Internal Medicine

## 2023-07-29 ENCOUNTER — Encounter: Payer: Self-pay | Admitting: Internal Medicine

## 2023-07-29 VITALS — BP 130/85 | HR 93 | Temp 98.2°F | Resp 16 | Ht 69.0 in | Wt 201.8 lb

## 2023-07-29 DIAGNOSIS — R4589 Other symptoms and signs involving emotional state: Secondary | ICD-10-CM | POA: Diagnosis not present

## 2023-07-29 DIAGNOSIS — I10 Essential (primary) hypertension: Secondary | ICD-10-CM | POA: Diagnosis not present

## 2023-07-29 DIAGNOSIS — Z6829 Body mass index (BMI) 29.0-29.9, adult: Secondary | ICD-10-CM

## 2023-07-29 DIAGNOSIS — E663 Overweight: Secondary | ICD-10-CM

## 2023-07-29 MED ORDER — CARVEDILOL 3.125 MG PO TABS
ORAL_TABLET | ORAL | 1 refills | Status: DC
Start: 2023-07-29 — End: 2023-12-31

## 2023-07-29 MED ORDER — PHENTERMINE HCL 37.5 MG PO TABS
37.5000 mg | ORAL_TABLET | Freq: Every day | ORAL | 0 refills | Status: AC
Start: 2023-07-29 — End: ?

## 2023-07-29 NOTE — Progress Notes (Signed)
[facility] [address] [place], [place] [address]  Internal MEDICINE  Office Visit Note  Patient Name: Susan Griffith  [id number]  [id number]  Date of Service: 07/29/2023  Chief Complaint  Patient presents with   Follow-up    Review Labs    HPI     Current Medication: Outpatient Encounter Medications as of 07/29/2023  Medication Sig   albuterol (VENTOLIN HFA) 108 (90 Base) MCG/ACT inhaler Inhale 2 puffs into the lungs every 6 (six) hours as needed for wheezing or shortness of breath.   carvedilol (COREG) 3.125 MG tablet Take one tab po at bedtime for elevated blood pressure   hydrochlorothiazide (HYDRODIURIL) 12.5 MG tablet Take 1 tablet (12.5 mg total) by mouth daily.   topiramate (TOPAMAX) 25 MG tablet TAKE 1 TABLET BY MOUTH TWICE DAILY   [DISCONTINUED] phentermine (ADIPEX-P) 37.5 MG tablet Take 1 tablet (37.5 mg total) by mouth daily before breakfast.   phentermine (ADIPEX-P) 37.5 MG tablet Take 1 tablet (37.5 mg total) by mouth daily before breakfast.   No facility-administered encounter medications on file as of 07/29/2023.    Surgical History: Past Surgical History:  Procedure Laterality Date   child birth     natural   thigh surgery  10/22/2018    Medical History: Past Medical History:  Diagnosis Date   Migraines    Sarcoidosis    Thyroid nodule     Family History: Family History  Problem Relation Age of Onset   Hyperlipidemia Mother    Hypertension Mother     Social History   Socioeconomic History   Marital status: Married    Spouse name: Not on file   Number of children: Not on file   Years of education: Not on file   Highest education level: Not on file  Occupational History   Not on file  Tobacco Use   Smoking status: Never   Smokeless tobacco: Never  Substance and Sexual Activity   Alcohol use: Yes    Comment: ocassionally   Drug use: Never   Sexual activity: Not on file  Other Topics Concern   Not on  file  Social History Narrative   Not on file   Social Determinants of Health   Financial Resource Strain: Not on file  Food Insecurity: Not on file  Transportation Needs: Not on file  Physical Activity: Not on file  Stress: Not on file  Social Connections: Not on file  Intimate Partner Violence: Not on file      Review of Systems  Constitutional:  Negative for fatigue and fever.  HENT:  Negative for congestion, mouth sores and postnasal drip.   Respiratory:  Negative for cough.   Cardiovascular:  Negative for chest pain.  Genitourinary:  Negative for flank pain.  Psychiatric/Behavioral: Negative.      Vital Signs: BP 130/85 Comment: 130/90  Pulse 93   Temp 98.2 F (36.8 C)   Resp 16   Ht 5\' 9"  (1.753 m)   Wt 201 lb 12.8 oz (91.5 kg)   LMP 02/11/2022 (Approximate)   SpO2 97%   BMI 29.80 kg/m    Physical Exam Constitutional:      Appearance: Normal appearance.  HENT:     Head: Normocephalic and atraumatic.     Nose: Nose normal.     Mouth/Throat:     Mouth: Mucous membranes are moist.     Pharynx: No posterior oropharyngeal erythema.  Eyes:     Extraocular Movements: Extraocular movements intact.  Pupils: Pupils are equal, round, and reactive to light.  Cardiovascular:     Pulses: Normal pulses.     Heart sounds: Normal heart sounds.  Pulmonary:     Effort: Pulmonary effort is normal.     Breath sounds: Normal breath sounds.  Neurological:     General: No focal deficit present.     Mental Status: She is alert.  Psychiatric:        Mood and Affect: Mood normal.        Behavior: Behavior normal.        Assessment/Plan: 1. Benign hypertension Pt has elevated HR with elevation in he rblood pressure, will add low does coreg at night, continue HCTn am  - carvedilol (COREG) 3.125 MG tablet; Take one tab po at bedtime for elevated blood pressure  Dispense: 90 tablet; Refill: 1  2. Overweight with body mass index (BMI) of 29 to 29.9 in  adult Continue Topamax  - phentermine (ADIPEX-P) 37.5 MG tablet; Take 1 tablet (37.5 mg total) by mouth daily before breakfast.  Dispense: 30 tablet; Refill: 0   3. Excitement She is a first time new grandmother and is very excited   General Counseling: Susan Griffith verbalizes understanding of the findings of todays visit and agrees with plan of treatment. I have discussed any further diagnostic evaluation that may be needed or ordered today. We also reviewed her medications today. she has been encouraged to call the office with any questions or concerns that should arise related to todays visit.    No orders of the defined types were placed in this encounter.   Meds ordered this encounter  Medications   carvedilol (COREG) 3.125 MG tablet    Sig: Take one tab po at bedtime for elevated blood pressure    Dispense:  90 tablet    Refill:  1   phentermine (ADIPEX-P) 37.5 MG tablet    Sig: Take 1 tablet (37.5 mg total) by mouth daily before breakfast.    Dispense:  30 tablet    Refill:  0    refill    Total time spent:35 Minutes Time spent includes review of chart, medications, test results, and follow up plan with the patient.   [place] Controlled Substance Database was reviewed by me.   Dr Lyndon Code Internal medicine

## 2023-08-02 ENCOUNTER — Encounter: Payer: Self-pay | Admitting: Nurse Practitioner

## 2023-10-14 DIAGNOSIS — M7989 Other specified soft tissue disorders: Secondary | ICD-10-CM | POA: Diagnosis not present

## 2023-10-14 DIAGNOSIS — I83812 Varicose veins of left lower extremities with pain: Secondary | ICD-10-CM | POA: Diagnosis not present

## 2023-10-17 DIAGNOSIS — I83812 Varicose veins of left lower extremities with pain: Secondary | ICD-10-CM | POA: Diagnosis not present

## 2023-10-17 DIAGNOSIS — M7989 Other specified soft tissue disorders: Secondary | ICD-10-CM | POA: Diagnosis not present

## 2023-10-24 DIAGNOSIS — M7989 Other specified soft tissue disorders: Secondary | ICD-10-CM | POA: Diagnosis not present

## 2023-10-24 DIAGNOSIS — I83811 Varicose veins of right lower extremities with pain: Secondary | ICD-10-CM | POA: Diagnosis not present

## 2023-10-28 DIAGNOSIS — I83811 Varicose veins of right lower extremities with pain: Secondary | ICD-10-CM | POA: Diagnosis not present

## 2023-10-28 DIAGNOSIS — M7989 Other specified soft tissue disorders: Secondary | ICD-10-CM | POA: Diagnosis not present

## 2023-10-31 ENCOUNTER — Other Ambulatory Visit: Payer: Self-pay | Admitting: Internal Medicine

## 2023-10-31 DIAGNOSIS — Z1231 Encounter for screening mammogram for malignant neoplasm of breast: Secondary | ICD-10-CM

## 2023-11-04 DIAGNOSIS — M7989 Other specified soft tissue disorders: Secondary | ICD-10-CM | POA: Diagnosis not present

## 2023-11-04 DIAGNOSIS — I83812 Varicose veins of left lower extremities with pain: Secondary | ICD-10-CM | POA: Diagnosis not present

## 2023-11-07 DIAGNOSIS — M7989 Other specified soft tissue disorders: Secondary | ICD-10-CM | POA: Diagnosis not present

## 2023-11-07 DIAGNOSIS — I83811 Varicose veins of right lower extremities with pain: Secondary | ICD-10-CM | POA: Diagnosis not present

## 2023-11-18 DIAGNOSIS — I83812 Varicose veins of left lower extremities with pain: Secondary | ICD-10-CM | POA: Diagnosis not present

## 2023-11-18 DIAGNOSIS — M7989 Other specified soft tissue disorders: Secondary | ICD-10-CM | POA: Diagnosis not present

## 2023-11-21 DIAGNOSIS — I83811 Varicose veins of right lower extremities with pain: Secondary | ICD-10-CM | POA: Diagnosis not present

## 2023-11-21 DIAGNOSIS — M7989 Other specified soft tissue disorders: Secondary | ICD-10-CM | POA: Diagnosis not present

## 2023-11-25 ENCOUNTER — Ambulatory Visit
Admission: RE | Admit: 2023-11-25 | Discharge: 2023-11-25 | Disposition: A | Discharge status: Home / Self Care | Payer: BC Managed Care – PPO | Source: Ambulatory Visit | Attending: Internal Medicine | Admitting: Internal Medicine

## 2023-11-25 DIAGNOSIS — Z1231 Encounter for screening mammogram for malignant neoplasm of breast: Secondary | ICD-10-CM | POA: Diagnosis not present

## 2023-12-09 DIAGNOSIS — M7989 Other specified soft tissue disorders: Secondary | ICD-10-CM | POA: Diagnosis not present

## 2023-12-09 DIAGNOSIS — I83812 Varicose veins of left lower extremities with pain: Secondary | ICD-10-CM | POA: Diagnosis not present

## 2023-12-19 DIAGNOSIS — I83811 Varicose veins of right lower extremities with pain: Secondary | ICD-10-CM | POA: Diagnosis not present

## 2023-12-19 DIAGNOSIS — M7989 Other specified soft tissue disorders: Secondary | ICD-10-CM | POA: Diagnosis not present

## 2023-12-31 ENCOUNTER — Ambulatory Visit: Admitting: Nurse Practitioner

## 2023-12-31 ENCOUNTER — Encounter: Payer: Self-pay | Admitting: Nurse Practitioner

## 2023-12-31 VITALS — BP 132/80 | HR 80 | Temp 98.1°F | Resp 16 | Ht 69.0 in | Wt 204.6 lb

## 2023-12-31 DIAGNOSIS — I10 Essential (primary) hypertension: Secondary | ICD-10-CM | POA: Diagnosis not present

## 2023-12-31 DIAGNOSIS — G43809 Other migraine, not intractable, without status migrainosus: Secondary | ICD-10-CM | POA: Diagnosis not present

## 2023-12-31 DIAGNOSIS — E66811 Obesity, class 1: Secondary | ICD-10-CM

## 2023-12-31 DIAGNOSIS — Z0001 Encounter for general adult medical examination with abnormal findings: Secondary | ICD-10-CM

## 2023-12-31 DIAGNOSIS — J4520 Mild intermittent asthma, uncomplicated: Secondary | ICD-10-CM | POA: Diagnosis not present

## 2023-12-31 DIAGNOSIS — E6609 Other obesity due to excess calories: Secondary | ICD-10-CM

## 2023-12-31 DIAGNOSIS — Z6830 Body mass index (BMI) 30.0-30.9, adult: Secondary | ICD-10-CM

## 2023-12-31 DIAGNOSIS — E663 Overweight: Secondary | ICD-10-CM

## 2023-12-31 MED ORDER — DIETHYLPROPION HCL ER 75 MG PO TB24
75.0000 mg | ORAL_TABLET | Freq: Every day | ORAL | 1 refills | Status: AC
Start: 2023-12-31 — End: ?

## 2023-12-31 MED ORDER — TOPIRAMATE 25 MG PO TABS: ORAL_TABLET | ORAL | 1 refills | Status: DC

## 2023-12-31 MED ORDER — ALBUTEROL SULFATE HFA 108 (90 BASE) MCG/ACT IN AERS
2.0000 | INHALATION_SPRAY | Freq: Four times a day (QID) | RESPIRATORY_TRACT | 2 refills | Status: AC | PRN
Start: 2023-12-31 — End: ?

## 2023-12-31 MED ORDER — HYDROCHLOROTHIAZIDE 12.5 MG PO TABS
12.5000 mg | ORAL_TABLET | Freq: Every day | ORAL | 3 refills | Status: AC
Start: 2023-12-31 — End: ?

## 2023-12-31 NOTE — Progress Notes (Signed)
 [facility] [address] [place], [place] [address]  Internal MEDICINE  Office Visit Note  Patient Name: Susan Griffith  [id number]  [id number]  Date of Service: 12/31/2023  Chief Complaint  Patient presents with   Follow-up    Weight loss wants to start medicine again     HPI Susan Griffith presents for a follow-up visit for weight loss, hypertension and migraines.  Weight loss -- Has a wedding coming up in November, wants to lose about 30 lbs which would be about 5 lbs per month. Has used phentermine  in the past  Hypertension -- controlled with hydrochlorothiazide .  Migraines -- topiramate  helps prevent migraines.     Current Medication: Outpatient Encounter Medications as of 12/31/2023  Medication Sig   Diethylpropion  HCl CR 75 MG TB24 Take 1 tablet (75 mg total) by mouth daily before breakfast.   phentermine  (ADIPEX-P ) 37.5 MG tablet Take 1 tablet (37.5 mg total) by mouth daily before breakfast.   [DISCONTINUED] albuterol  (VENTOLIN  HFA) 108 (90 Base) MCG/ACT inhaler Inhale 2 puffs into the lungs every 6 (six) hours as needed for wheezing or shortness of breath.   [DISCONTINUED] carvedilol  (COREG ) 3.125 MG tablet Take one tab po at bedtime for elevated blood pressure   [DISCONTINUED] hydrochlorothiazide  (HYDRODIURIL ) 12.5 MG tablet Take 1 tablet (12.5 mg total) by mouth daily.   [DISCONTINUED] topiramate  (TOPAMAX ) 25 MG tablet TAKE 1 TABLET BY MOUTH TWICE DAILY   albuterol  (VENTOLIN  HFA) 108 (90 Base) MCG/ACT inhaler Inhale 2 puffs into the lungs every 6 (six) hours as needed for wheezing or shortness of breath.   hydrochlorothiazide  (HYDRODIURIL ) 12.5 MG tablet Take 1 tablet (12.5 mg total) by mouth daily.   topiramate  (TOPAMAX ) 25 MG tablet TAKE 1 TABLET BY MOUTH TWICE DAILY   No facility-administered encounter medications on file as of 12/31/2023.    Surgical History: Past Surgical History:  Procedure Laterality Date   AUGMENTATION MAMMAPLASTY Bilateral  02/2023   child birth     natural   thigh surgery  10/22/2018    Medical History: Past Medical History:  Diagnosis Date   Migraines    Sarcoidosis    Thyroid  nodule     Family History: Family History  Problem Relation Age of Onset   Hyperlipidemia Mother    Hypertension Mother     Social History   Socioeconomic History   Marital status: Married    Spouse name: Not on file   Number of children: Not on file   Years of education: Not on file   Highest education level: Not on file  Occupational History   Not on file  Tobacco Use   Smoking status: Never   Smokeless tobacco: Never  Substance and Sexual Activity   Alcohol use: Yes    Comment: ocassionally   Drug use: Never   Sexual activity: Not on file  Other Topics Concern   Not on file  Social History Narrative   Not on file   Social Drivers of Health   Financial Resource Strain: Not on file  Food Insecurity: Not on file  Transportation Needs: Not on file  Physical Activity: Not on file  Stress: Not on file  Social Connections: Not on file  Intimate Partner Violence: Not on file      Review of Systems  Constitutional:  Positive for unexpected weight change. Negative for fatigue and fever.  HENT:  Negative for congestion, mouth sores and postnasal drip.   Respiratory:  Negative for cough.   Cardiovascular:  Negative  for chest pain.  Genitourinary:  Negative for flank pain.  Psychiatric/Behavioral: Negative.      Vital Signs: BP 132/80   Pulse 80   Temp 98.1 F (36.7 C)   Resp 16   Ht 5\' 9"  (1.753 m)   Wt 204 lb 9.6 oz (92.8 kg)   LMP 02/11/2022 (Approximate)   SpO2 99%   BMI 30.21 kg/m    Physical Exam Vitals reviewed.  Constitutional:      General: She is not in acute distress.    Appearance: Normal appearance. She is not ill-appearing.  HENT:     Head: Normocephalic and atraumatic.  Eyes:     Pupils: Pupils are equal, round, and reactive to light.  Cardiovascular:     Rate and  Rhythm: Normal rate and regular rhythm.  Pulmonary:     Effort: Pulmonary effort is normal. No respiratory distress.  Neurological:     Mental Status: She is alert and oriented to person, place, and time.  Psychiatric:        Mood and Affect: Mood normal.        Behavior: Behavior normal.        Assessment/Plan: 1. Benign hypertension (Primary) Stable continue hydrochlorothiazide  as prescribed.  - hydrochlorothiazide  (HYDRODIURIL ) 12.5 MG tablet; Take 1 tablet (12.5 mg total) by mouth daily.  Dispense: 90 tablet; Refill: 3  2. Mild intermittent asthma with allergic rhinitis without complication Continue prn albuterol  inhaler as prescribed.  - albuterol  (VENTOLIN  HFA) 108 (90 Base) MCG/ACT inhaler; Inhale 2 puffs into the lungs every 6 (six) hours as needed for wheezing or shortness of breath.  Dispense: 18 g; Refill: 2  3. Other migraine without status migrainosus, not intractable Continue topiramate  as prescribed.  - topiramate  (TOPAMAX ) 25 MG tablet; TAKE 1 TABLET BY MOUTH TWICE DAILY  Dispense: 180 tablet; Refill: 1  4. Class 1 obesity due to excess calories with serious comorbidity and body mass index (BMI) of 30.0 to 30.9 in adult Start diethylpropion  as prescribed. Follow up in 8 weeks for weigh in - topiramate  (TOPAMAX ) 25 MG tablet; TAKE 1 TABLET BY MOUTH TWICE DAILY  Dispense: 180 tablet; Refill: 1 - Diethylpropion  HCl CR 75 MG TB24; Take 1 tablet (75 mg total) by mouth daily before breakfast.  Dispense: 30 tablet; Refill: 1   General Counseling: [name] verbalizes understanding of the findings of todays visit and agrees with plan of treatment. I have discussed any further diagnostic evaluation that may be needed or ordered today. We also reviewed her medications today. she has been encouraged to call the office with any questions or concerns that should arise related to todays visit.    No orders of the defined types were placed in this encounter.   Meds ordered this  encounter  Medications   topiramate  (TOPAMAX ) 25 MG tablet    Sig: TAKE 1 TABLET BY MOUTH TWICE DAILY    Dispense:  180 tablet    Refill:  1   Diethylpropion  HCl CR 75 MG TB24    Sig: Take 1 tablet (75 mg total) by mouth daily before breakfast.    Dispense:  30 tablet    Refill:  1    Fill new script today, please use goodrx card if not covered by her insurance.   hydrochlorothiazide  (HYDRODIURIL ) 12.5 MG tablet    Sig: Take 1 tablet (12.5 mg total) by mouth daily.    Dispense:  90 tablet    Refill:  3   albuterol  (VENTOLIN  HFA) 108 (90  Base) MCG/ACT inhaler    Sig: Inhale 2 puffs into the lungs every 6 (six) hours as needed for wheezing or shortness of breath.    Dispense:  18 g    Refill:  2    Return in about 8 weeks (around 02/25/2024) for F/U, Weight loss, Liem Copenhaver PCP, eval new med.   Total time spent:30 Minutes Time spent includes review of chart, medications, test results, and follow up plan with the patient.   [place] Controlled Substance Database was reviewed by me.  This patient was seen by Laurence Pons, FNP-C in collaboration with Dr. Verneta Gone as a part of collaborative care agreement.   Kaeli Nichelson R. Bobbi Burow, MSN, FNP-C Internal medicine

## 2024-02-01 ENCOUNTER — Encounter: Payer: Self-pay | Admitting: Nurse Practitioner

## 2024-02-25 ENCOUNTER — Ambulatory Visit: Admitting: Nurse Practitioner

## 2024-02-25 ENCOUNTER — Encounter: Payer: Self-pay | Admitting: Nurse Practitioner

## 2024-02-25 VITALS — BP 130/78 | HR 79 | Temp 97.6°F | Resp 16 | Ht 69.0 in | Wt 202.2 lb

## 2024-02-25 DIAGNOSIS — Z6829 Body mass index (BMI) 29.0-29.9, adult: Secondary | ICD-10-CM | POA: Diagnosis not present

## 2024-02-25 DIAGNOSIS — E663 Overweight: Secondary | ICD-10-CM | POA: Diagnosis not present

## 2024-02-25 DIAGNOSIS — I10 Essential (primary) hypertension: Secondary | ICD-10-CM | POA: Diagnosis not present

## 2024-02-25 DIAGNOSIS — G43809 Other migraine, not intractable, without status migrainosus: Secondary | ICD-10-CM | POA: Diagnosis not present

## 2024-02-25 MED ORDER — PHENTERMINE HCL 37.5 MG PO TABS
37.5000 mg | ORAL_TABLET | Freq: Every day | ORAL | 1 refills | Status: DC
Start: 2024-02-25 — End: 2024-04-21

## 2024-02-25 NOTE — Progress Notes (Signed)
 [facility] [address] [place], [place] [address]  Internal MEDICINE  Office Visit Note  Patient Name: Susan Griffith  [id number]  [id number]  Date of Service: 02/25/2024  Chief Complaint  Patient presents with   Follow-up    Weight loss     HPI Darriel presents for a follow-up visit for hypertension, migraines and weight loss.   Weight loss -- started on diethylpropion  in April. Has lost 2 lbs since last visit. Does report that her clothes are fitting looser. Phentermine  did produce better results for the patient in the past.  Hypertension -- controlled with current medication  Migraines -- doing well with topiramate , no issues.    Current Medication: Outpatient Encounter Medications as of 02/25/2024  Medication Sig   phentermine  (ADIPEX-P ) 37.5 MG tablet Take 1 tablet (37.5 mg total) by mouth daily before breakfast.   albuterol  (VENTOLIN  HFA) 108 (90 Base) MCG/ACT inhaler Inhale 2 puffs into the lungs every 6 (six) hours as needed for wheezing or shortness of breath.   Diethylpropion  HCl CR 75 MG TB24 Take 1 tablet (75 mg total) by mouth daily before breakfast.   hydrochlorothiazide  (HYDRODIURIL ) 12.5 MG tablet Take 1 tablet (12.5 mg total) by mouth daily.   topiramate  (TOPAMAX ) 25 MG tablet TAKE 1 TABLET BY MOUTH TWICE DAILY   [DISCONTINUED] phentermine  (ADIPEX-P ) 37.5 MG tablet Take 1 tablet (37.5 mg total) by mouth daily before breakfast.   No facility-administered encounter medications on file as of 02/25/2024.    Surgical History: Past Surgical History:  Procedure Laterality Date   AUGMENTATION MAMMAPLASTY Bilateral 02/2023   child birth     natural   thigh surgery  10/22/2018    Medical History: Past Medical History:  Diagnosis Date   Migraines    Sarcoidosis    Thyroid  nodule     Family History: Family History  Problem Relation Age of Onset   Hyperlipidemia Mother    Hypertension Mother     Social History   Socioeconomic  History   Marital status: Married    Spouse name: Not on file   Number of children: Not on file   Years of education: Not on file   Highest education level: Not on file  Occupational History   Not on file  Tobacco Use   Smoking status: Never   Smokeless tobacco: Never  Substance and Sexual Activity   Alcohol use: Yes    Comment: ocassionally   Drug use: Never   Sexual activity: Not on file  Other Topics Concern   Not on file  Social History Narrative   Not on file   Social Drivers of Health   Financial Resource Strain: Not on file  Food Insecurity: Not on file  Transportation Needs: Not on file  Physical Activity: Not on file  Stress: Not on file  Social Connections: Not on file  Intimate Partner Violence: Not on file      Review of Systems  Constitutional:  Positive for unexpected weight change. Negative for fatigue and fever.  HENT:  Negative for congestion, mouth sores and postnasal drip.   Respiratory:  Negative for cough.   Cardiovascular:  Negative for chest pain.  Genitourinary:  Negative for flank pain.  Psychiatric/Behavioral: Negative.      Vital Signs: BP 130/78   Pulse 79   Temp 97.6 F (36.4 C)   Resp 16   Ht 5' 9 (1.753 m)   Wt 202 lb 3.2 oz (91.7 kg)   LMP 02/11/2022 (Approximate)  SpO2 98%   BMI 29.86 kg/m    Physical Exam Vitals reviewed.  Constitutional:      General: She is not in acute distress.    Appearance: Normal appearance. She is not ill-appearing.  HENT:     Head: Normocephalic and atraumatic.   Eyes:     Pupils: Pupils are equal, round, and reactive to light.    Cardiovascular:     Rate and Rhythm: Normal rate and regular rhythm.  Pulmonary:     Effort: Pulmonary effort is normal. No respiratory distress.   Skin:    Capillary Refill: Capillary refill takes less than 2 seconds.   Neurological:     Mental Status: She is alert and oriented to person, place, and time.   Psychiatric:        Mood and Affect: Mood  normal.        Behavior: Behavior normal.        Assessment/Plan: 1. Benign hypertension (Primary) Continue hydrochlorothiazide  as prescribed.   2. Other migraine without status migrainosus, not intractable Continue topiramate  as prescribed.   3. Overweight with body mass index (BMI) of 29 to 29.9 in adult Discontinue diethylpropion  and start phentermine  as prescribed.  - phentermine  (ADIPEX-P ) 37.5 MG tablet; Take 1 tablet (37.5 mg total) by mouth daily before breakfast.  Dispense: 30 tablet; Refill: 1   General Counseling: Fawnda verbalizes understanding of the findings of todays visit and agrees with plan of treatment. I have discussed any further diagnostic evaluation that may be needed or ordered today. We also reviewed her medications today. she has been encouraged to call the office with any questions or concerns that should arise related to todays visit.    No orders of the defined types were placed in this encounter.   Meds ordered this encounter  Medications   phentermine  (ADIPEX-P ) 37.5 MG tablet    Sig: Take 1 tablet (37.5 mg total) by mouth daily before breakfast.    Dispense:  30 tablet    Refill:  1    Fill new script today, discontinue diethylpropion .    Return in about 8 weeks (around 04/21/2024) for F/U, Weight loss, eval new med, Ranell Finelli PCP.   Total time spent:30 Minutes Time spent includes review of chart, medications, test results, and follow up plan with the patient.   [place] Controlled Substance Database was reviewed by me.  This patient was seen by Laurence Pons, FNP-C in collaboration with Dr. Verneta Gone as a part of collaborative care agreement.   [facility] Teegarden R. Bobbi Burow, MSN, FNP-C Internal medicine

## 2024-04-21 ENCOUNTER — Encounter: Payer: Self-pay | Admitting: Nurse Practitioner

## 2024-04-21 ENCOUNTER — Ambulatory Visit: Admitting: Nurse Practitioner

## 2024-04-21 VITALS — BP 110/84 | HR 78 | Temp 97.1°F | Resp 16 | Ht 69.0 in | Wt 194.0 lb

## 2024-04-21 DIAGNOSIS — E663 Overweight: Secondary | ICD-10-CM

## 2024-04-21 DIAGNOSIS — Z6828 Body mass index (BMI) 28.0-28.9, adult: Secondary | ICD-10-CM

## 2024-04-21 DIAGNOSIS — E559 Vitamin D deficiency, unspecified: Secondary | ICD-10-CM

## 2024-04-21 DIAGNOSIS — I10 Essential (primary) hypertension: Secondary | ICD-10-CM | POA: Diagnosis not present

## 2024-04-21 MED ORDER — PHENTERMINE HCL 37.5 MG PO TABS
37.5000 mg | ORAL_TABLET | Freq: Every day | ORAL | 1 refills | Status: DC
Start: 2024-04-21 — End: 2024-05-18

## 2024-04-21 NOTE — Progress Notes (Signed)
 [facility] [address] [place], [place] [address]  Internal MEDICINE  Office Visit Note  Patient Name: Susan Griffith  [id number]  [id number]  Date of Service: 04/21/2024  Chief Complaint  Patient presents with   Follow-up    Weight loss     HPI Susan Griffith presents for a follow-up visit for weight loss, hypertension and low vitamin D  Weight loss -- she has lost 8 lbs since last office visit on phentermine . She wants to continue the medication. She has a wedding in November to go to and a high school reunion in October and wants to lose a little more weight before those social engagements.  Hypertension -- taking hydrochlorothiazide  daily, BP is controlled Low vitamin D  -- takes OTC supplement.     Current Medication: Outpatient Encounter Medications as of 04/21/2024  Medication Sig   albuterol  (VENTOLIN  HFA) 108 (90 Base) MCG/ACT inhaler Inhale 2 puffs into the lungs every 6 (six) hours as needed for wheezing or shortness of breath.   hydrochlorothiazide  (HYDRODIURIL ) 12.5 MG tablet Take 1 tablet (12.5 mg total) by mouth daily.   phentermine  (ADIPEX-P ) 37.5 MG tablet Take 1 tablet (37.5 mg total) by mouth daily before breakfast.   topiramate  (TOPAMAX ) 25 MG tablet TAKE 1 TABLET BY MOUTH TWICE DAILY   [DISCONTINUED] phentermine  (ADIPEX-P ) 37.5 MG tablet Take 1 tablet (37.5 mg total) by mouth daily before breakfast.   No facility-administered encounter medications on file as of 04/21/2024.    Surgical History: Past Surgical History:  Procedure Laterality Date   AUGMENTATION MAMMAPLASTY Bilateral 02/2023   child birth     natural   thigh surgery  10/22/2018    Medical History: Past Medical History:  Diagnosis Date   Migraines    Sarcoidosis    Thyroid  nodule     Family History: Family History  Problem Relation Age of Onset   Hyperlipidemia Mother    Hypertension Mother     Social History   Socioeconomic History   Marital status: Married     Spouse name: Not on file   Number of children: Not on file   Years of education: Not on file   Highest education level: Not on file  Occupational History   Not on file  Tobacco Use   Smoking status: Never   Smokeless tobacco: Never  Substance and Sexual Activity   Alcohol use: Yes    Comment: ocassionally   Drug use: Never   Sexual activity: Not on file  Other Topics Concern   Not on file  Social History Narrative   Not on file   Social Drivers of Health   Financial Resource Strain: Not on file  Food Insecurity: Not on file  Transportation Needs: Not on file  Physical Activity: Not on file  Stress: Not on file  Social Connections: Not on file  Intimate Partner Violence: Not on file      Review of Systems  Constitutional:  Positive for unexpected weight change. Negative for fatigue and fever.  HENT:  Negative for congestion, mouth sores and postnasal drip.   Respiratory:  Negative for cough.   Cardiovascular:  Negative for chest pain.  Genitourinary:  Negative for flank pain.  Psychiatric/Behavioral: Negative.      Vital Signs: BP 110/84   Pulse 78   Temp (!) 97.1 F (36.2 C)   Resp 16   Ht 5' 9 (1.753 m)   Wt 194 lb (88 kg) Comment: on home scale  LMP 02/11/2022 (Approximate)  SpO2 98%   BMI 28.65 kg/m    Physical Exam Vitals reviewed.  Constitutional:      General: She is not in acute distress.    Appearance: Normal appearance. She is not ill-appearing.  HENT:     Head: Normocephalic and atraumatic.  Eyes:     Pupils: Pupils are equal, round, and reactive to light.  Cardiovascular:     Rate and Rhythm: Normal rate and regular rhythm.  Pulmonary:     Effort: Pulmonary effort is normal. No respiratory distress.  Skin:    Capillary Refill: Capillary refill takes less than 2 seconds.  Neurological:     Mental Status: She is alert and oriented to person, place, and time.  Psychiatric:        Mood and Affect: Mood normal.        Behavior:  Behavior normal.        Assessment/Plan: 1. Benign hypertension (Primary) Stable, continue hydrochlorothiazide  as prescribed.   2. Vitamin D  deficiency Continue OTC vitamin D  supplement   3. Overweight with body mass index (BMI) of 28 to 28.9 in adult Continue phentermine  as prescribed, follow up in 8 weeks . - phentermine  (ADIPEX-P ) 37.5 MG tablet; Take 1 tablet (37.5 mg total) by mouth daily before breakfast.  Dispense: 30 tablet; Refill: 1   General Counseling: Maize verbalizes understanding of the findings of todays visit and agrees with plan of treatment. I have discussed any further diagnostic evaluation that may be needed or ordered today. We also reviewed her medications today. she has been encouraged to call the office with any questions or concerns that should arise related to todays visit.    No orders of the defined types were placed in this encounter.   Meds ordered this encounter  Medications   phentermine  (ADIPEX-P ) 37.5 MG tablet    Sig: Take 1 tablet (37.5 mg total) by mouth daily before breakfast.    Dispense:  30 tablet    Refill:  1    For future refills.    Return in about 8 weeks (around 06/16/2024) for F/U, Shawn Carattini PCP.   Total time spent:30 Minutes Time spent includes review of chart, medications, test results, and follow up plan with the patient.   [place] Controlled Substance Database was reviewed by me.  This patient was seen by Mardy Maxin, FNP-C in collaboration with Dr. Sigrid Bathe as a part of collaborative care agreement.   Yoav Okane R. Maxin, MSN, FNP-C Internal medicine

## 2024-04-22 ENCOUNTER — Encounter: Payer: Self-pay | Admitting: Nurse Practitioner

## 2024-05-18 ENCOUNTER — Encounter: Payer: Self-pay | Admitting: Nurse Practitioner

## 2024-05-18 ENCOUNTER — Ambulatory Visit: Admitting: Nurse Practitioner

## 2024-05-18 VITALS — BP 122/86 | HR 84 | Temp 96.7°F | Resp 16 | Ht 69.0 in | Wt 192.8 lb

## 2024-05-18 DIAGNOSIS — E663 Overweight: Secondary | ICD-10-CM

## 2024-05-18 DIAGNOSIS — Z6828 Body mass index (BMI) 28.0-28.9, adult: Secondary | ICD-10-CM

## 2024-05-18 DIAGNOSIS — E559 Vitamin D deficiency, unspecified: Secondary | ICD-10-CM

## 2024-05-18 DIAGNOSIS — I10 Essential (primary) hypertension: Secondary | ICD-10-CM | POA: Diagnosis not present

## 2024-05-18 MED ORDER — PHENTERMINE HCL 37.5 MG PO TABS
37.5000 mg | ORAL_TABLET | Freq: Every day | ORAL | 1 refills | Status: DC
Start: 2024-05-18 — End: 2024-06-22

## 2024-05-18 NOTE — Progress Notes (Signed)
 [facility] [address] [place], [place] [address]  Internal MEDICINE  Office Visit Note  Patient Name: Susan Griffith  [id number]  [id number]  Date of Service: 05/18/2024  Chief Complaint  Patient presents with   Follow-up    HPI Susan Griffith presents for a follow-up visit for weight loss, hypertension and low vitamin D  Weight loss -- she has lost 2 more lbs since last office visit on phentermine . She feels like she has plateaued at this point and the phentermine  is not working as well. She has a wedding in November to go to and a high school reunion in October and wants to lose a little more weight before those social engagements.  Hypertension -- controlled with hydrochlorothiazide   Low vitamin D  -- takes OTC supplement.      Current Medication: Outpatient Encounter Medications as of 05/18/2024  Medication Sig   albuterol  (VENTOLIN  HFA) 108 (90 Base) MCG/ACT inhaler Inhale 2 puffs into the lungs every 6 (six) hours as needed for wheezing or shortness of breath.   hydrochlorothiazide  (HYDRODIURIL ) 12.5 MG tablet Take 1 tablet (12.5 mg total) by mouth daily.   phentermine  (ADIPEX-P ) 37.5 MG tablet Take 1 tablet (37.5 mg total) by mouth daily before breakfast.   topiramate  (TOPAMAX ) 25 MG tablet TAKE 1 TABLET BY MOUTH TWICE DAILY   [DISCONTINUED] phentermine  (ADIPEX-P ) 37.5 MG tablet Take 1 tablet (37.5 mg total) by mouth daily before breakfast.   No facility-administered encounter medications on file as of 05/18/2024.    Surgical History: Past Surgical History:  Procedure Laterality Date   AUGMENTATION MAMMAPLASTY Bilateral 02/2023   child birth     natural   thigh surgery  10/22/2018    Medical History: Past Medical History:  Diagnosis Date   Migraines    Sarcoidosis    Thyroid  nodule     Family History: Family History  Problem Relation Age of Onset   Hyperlipidemia Mother    Hypertension Mother     Social History   Socioeconomic History    Marital status: Married    Spouse name: Not on file   Number of children: Not on file   Years of education: Not on file   Highest education level: Not on file  Occupational History   Not on file  Tobacco Use   Smoking status: Never   Smokeless tobacco: Never  Substance and Sexual Activity   Alcohol use: Yes    Comment: ocassionally   Drug use: Never   Sexual activity: Not on file  Other Topics Concern   Not on file  Social History Narrative   Not on file   Social Drivers of Health   Financial Resource Strain: Not on file  Food Insecurity: Not on file  Transportation Needs: Not on file  Physical Activity: Not on file  Stress: Not on file  Social Connections: Not on file  Intimate Partner Violence: Not on file      Review of Systems  Constitutional:  Positive for unexpected weight change. Negative for fatigue and fever.  HENT:  Negative for congestion, mouth sores and postnasal drip.   Respiratory:  Negative for cough.   Cardiovascular:  Negative for chest pain.  Genitourinary:  Negative for flank pain.  Psychiatric/Behavioral: Negative.      Vital Signs: BP 122/86   Pulse 84   Temp (!) 96.7 F (35.9 C)   Resp 16   Ht 5' 9 (1.753 m)   Wt 192 lb 12.8 oz (87.5 kg) Comment: at home  LMP 02/11/2022 (Approximate)   SpO2 96%   BMI 28.47 kg/m    Physical Exam Vitals reviewed.  Constitutional:      General: She is not in acute distress.    Appearance: Normal appearance. She is not ill-appearing.  HENT:     Head: Normocephalic and atraumatic.  Eyes:     Pupils: Pupils are equal, round, and reactive to light.  Cardiovascular:     Rate and Rhythm: Normal rate and regular rhythm.  Pulmonary:     Effort: Pulmonary effort is normal. No respiratory distress.  Skin:    Capillary Refill: Capillary refill takes less than 2 seconds.  Neurological:     Mental Status: She is alert and oriented to person, place, and time.  Psychiatric:        Mood and Affect: Mood  normal.        Behavior: Behavior normal.        Assessment/Plan: 1. Benign hypertension (Primary) Stable, continue medication as prescribed.   2. Vitamin D  deficiency Continue vitamin D  supplement  3. Overweight with body mass index (BMI) of 28 to 28.9 in adult Continue phentermine  as prescribed. Sample of zepbound given to patient to help her get to her goal in the next month  - phentermine  (ADIPEX-P ) 37.5 MG tablet; Take 1 tablet (37.5 mg total) by mouth daily before breakfast.  Dispense: 30 tablet; Refill: 1   General Counseling: Susan Griffith verbalizes understanding of the findings of todays visit and agrees with plan of treatment. I have discussed any further diagnostic evaluation that may be needed or ordered today. We also reviewed her medications today. she has been encouraged to call the office with any questions or concerns that should arise related to todays visit.    No orders of the defined types were placed in this encounter.   Meds ordered this encounter  Medications   phentermine  (ADIPEX-P ) 37.5 MG tablet    Sig: Take 1 tablet (37.5 mg total) by mouth daily before breakfast.    Dispense:  30 tablet    Refill:  1    For future refills.    Return in about 1 month (around 06/17/2024) for F/U, eval new med, Susan Griffith PCP.   Total time spent:30 Minutes Time spent includes review of chart, medications, test results, and follow up plan with the patient.   [place] Controlled Substance Database was reviewed by me.  This patient was seen by Susan Maxin, FNP-C in collaboration with Dr. Sigrid Griffith as a part of collaborative care agreement.   Susan Griffith R. Maxin, MSN, FNP-C Internal medicine

## 2024-05-19 ENCOUNTER — Ambulatory Visit: Admitting: Nurse Practitioner

## 2024-05-19 ENCOUNTER — Encounter: Payer: Self-pay | Admitting: Nurse Practitioner

## 2024-06-17 ENCOUNTER — Ambulatory Visit: Admitting: Nurse Practitioner

## 2024-06-22 ENCOUNTER — Ambulatory Visit (INDEPENDENT_AMBULATORY_CARE_PROVIDER_SITE_OTHER): Payer: BC Managed Care – PPO | Admitting: Nurse Practitioner

## 2024-06-22 ENCOUNTER — Encounter: Payer: Self-pay | Admitting: Nurse Practitioner

## 2024-06-22 VITALS — BP 124/86 | HR 71 | Temp 95.9°F | Resp 16 | Ht 69.0 in | Wt 190.6 lb

## 2024-06-22 DIAGNOSIS — E66811 Obesity, class 1: Secondary | ICD-10-CM | POA: Diagnosis not present

## 2024-06-22 DIAGNOSIS — Z6828 Body mass index (BMI) 28.0-28.9, adult: Secondary | ICD-10-CM

## 2024-06-22 DIAGNOSIS — I10 Essential (primary) hypertension: Secondary | ICD-10-CM

## 2024-06-22 DIAGNOSIS — J4520 Mild intermittent asthma, uncomplicated: Secondary | ICD-10-CM

## 2024-06-22 DIAGNOSIS — Z0001 Encounter for general adult medical examination with abnormal findings: Secondary | ICD-10-CM

## 2024-06-22 DIAGNOSIS — J4521 Mild intermittent asthma with (acute) exacerbation: Secondary | ICD-10-CM

## 2024-06-22 DIAGNOSIS — E6609 Other obesity due to excess calories: Secondary | ICD-10-CM

## 2024-06-22 DIAGNOSIS — Z6830 Body mass index (BMI) 30.0-30.9, adult: Secondary | ICD-10-CM

## 2024-06-22 DIAGNOSIS — G43809 Other migraine, not intractable, without status migrainosus: Secondary | ICD-10-CM

## 2024-06-22 MED ORDER — PHENTERMINE HCL 37.5 MG PO TABS: 37.5000 mg | ORAL_TABLET | Freq: Every day | ORAL | 1 refills | Status: DC

## 2024-06-22 MED ORDER — TOPIRAMATE 25 MG PO TABS
ORAL_TABLET | ORAL | 1 refills | Status: AC
Start: 2024-06-22 — End: ?

## 2024-06-22 NOTE — Progress Notes (Signed)
 [facility] [address] [place], [place] [address]  Internal MEDICINE  Office Visit Note  Patient Name: Susan Griffith  [id number]  [id number]  Date of Service: 06/22/2024  Chief Complaint  Patient presents with   Annual Exam    HPI Susan Griffith presents for an annual well visit and physical exam.  Well-appearing 58 y.o. female with hypertension, hypothyroidism, mild asthma, and sarcoidosis.  Routine CRC screening: cologuard was negative, done in October 2024, due again in 2027 Routine mammogram: done in march this year  DEXA scan: not due yet  Pap smear: due in September 2026 Eye exam and/or foot exam: Labs: due for routine labs  New or worsening pain: none  Other concerns: working on weight loss for upcoming reunions and a wedding in November.     Current Medication: Outpatient Encounter Medications as of 06/22/2024  Medication Sig   albuterol  (VENTOLIN  HFA) 108 (90 Base) MCG/ACT inhaler Inhale 2 puffs into the lungs every 6 (six) hours as needed for wheezing or shortness of breath.   hydrochlorothiazide  (HYDRODIURIL ) 12.5 MG tablet Take 1 tablet (12.5 mg total) by mouth daily.   phentermine  (ADIPEX-P ) 37.5 MG tablet Take 1 tablet (37.5 mg total) by mouth daily before breakfast.   topiramate  (TOPAMAX ) 25 MG tablet TAKE 1 TABLET BY MOUTH TWICE DAILY   [DISCONTINUED] phentermine  (ADIPEX-P ) 37.5 MG tablet Take 1 tablet (37.5 mg total) by mouth daily before breakfast.   [DISCONTINUED] topiramate  (TOPAMAX ) 25 MG tablet TAKE 1 TABLET BY MOUTH TWICE DAILY   No facility-administered encounter medications on file as of 06/22/2024.    Surgical History: Past Surgical History:  Procedure Laterality Date   AUGMENTATION MAMMAPLASTY Bilateral 02/2023   child birth     natural   thigh surgery  10/22/2018    Medical History: Past Medical History:  Diagnosis Date   Migraines    Sarcoidosis    Thyroid  nodule     Family History: Family History  Problem  Relation Age of Onset   Hyperlipidemia Mother    Hypertension Mother     Social History   Socioeconomic History   Marital status: Married    Spouse name: Not on file   Number of children: Not on file   Years of education: Not on file   Highest education level: Not on file  Occupational History   Not on file  Tobacco Use   Smoking status: Never   Smokeless tobacco: Never  Substance and Sexual Activity   Alcohol use: Yes    Comment: ocassionally   Drug use: Never   Sexual activity: Not on file  Other Topics Concern   Not on file  Social History Narrative   Not on file   Social Drivers of Health   Financial Resource Strain: Not on file  Food Insecurity: Not on file  Transportation Needs: Not on file  Physical Activity: Not on file  Stress: Not on file  Social Connections: Not on file  Intimate Partner Violence: Not on file      Review of Systems  Constitutional:  Positive for appetite change, fatigue and unexpected weight change. Negative for activity change, chills and fever.  HENT: Negative.  Negative for congestion, ear pain, rhinorrhea, sore throat and trouble swallowing.   Eyes: Negative.   Respiratory: Negative.  Negative for cough, chest tightness, shortness of breath and wheezing.   Cardiovascular: Negative.  Negative for chest pain.  Gastrointestinal: Negative.  Negative for abdominal pain, blood in stool, constipation, diarrhea, nausea and vomiting.  Endocrine: Negative.   Genitourinary: Negative.  Negative for difficulty urinating, dysuria, frequency, hematuria and urgency.  Musculoskeletal: Negative.  Negative for arthralgias, back pain, joint swelling, myalgias and neck pain.  Skin: Negative.  Negative for rash and wound.  Allergic/Immunologic: Negative.  Negative for immunocompromised state.  Neurological: Negative.  Negative for dizziness, seizures, numbness and headaches.  Hematological: Negative.   Psychiatric/Behavioral: Negative.  Negative for  behavioral problems, self-injury and suicidal ideas. The patient is not nervous/anxious.     Vital Signs: BP 124/86   Pulse 71   Temp (!) 95.9 F (35.5 C)   Resp 16   Ht 5' 9 (1.753 m)   Wt 190 lb 9.6 oz (86.5 kg)   LMP 02/11/2022 (Approximate)   SpO2 98%   BMI 28.15 kg/m    Physical Exam Vitals reviewed.  Constitutional:      General: She is not in acute distress.    Appearance: Normal appearance. She is well-developed. She is not ill-appearing or diaphoretic.  HENT:     Head: Normocephalic and atraumatic.     Right Ear: Tympanic membrane, ear canal and external ear normal.     Left Ear: Tympanic membrane, ear canal and external ear normal.     Nose: Nose normal. No congestion or rhinorrhea.     Mouth/Throat:     Mouth: Mucous membranes are moist.     Pharynx: Oropharynx is clear. No oropharyngeal exudate or posterior oropharyngeal erythema.  Eyes:     General: No scleral icterus.       Right eye: No discharge.        Left eye: No discharge.     Extraocular Movements: Extraocular movements intact.     Conjunctiva/sclera: Conjunctivae normal.     Pupils: Pupils are equal, round, and reactive to light.  Neck:     Thyroid : No thyromegaly.     Vascular: No JVD.     Trachea: No tracheal deviation.  Cardiovascular:     Rate and Rhythm: Normal rate and regular rhythm.     Pulses: Normal pulses.     Heart sounds: Normal heart sounds. No murmur heard.    No friction rub. No gallop.  Pulmonary:     Effort: Pulmonary effort is normal. No respiratory distress.     Breath sounds: Normal breath sounds. No stridor. No wheezing or rales.  Chest:     Chest wall: No tenderness.  Abdominal:     General: Bowel sounds are normal. There is no distension.     Palpations: Abdomen is soft. There is no mass.     Tenderness: There is no abdominal tenderness. There is no guarding or rebound.  Musculoskeletal:        General: No tenderness or deformity. Normal range of motion.      Cervical back: Normal range of motion and neck supple.  Lymphadenopathy:     Cervical: No cervical adenopathy.  Skin:    General: Skin is warm and dry.     Capillary Refill: Capillary refill takes less than 2 seconds.     Coloration: Skin is not pale.     Findings: No erythema or rash.  Neurological:     Mental Status: She is alert and oriented to person, place, and time.     Cranial Nerves: No cranial nerve deficit.     Motor: No abnormal muscle tone.     Coordination: Coordination normal.     Deep Tendon Reflexes: Reflexes are normal and symmetric.  Psychiatric:  Behavior: Behavior normal.        Thought Content: Thought content normal.        Judgment: Judgment normal.        Assessment/Plan: 1. Encounter for routine adult health examination with abnormal findings (Primary) Age-appropriate preventive screenings and vaccinations discussed, annual physical exam completed. Routine labs for health maintenance ordered, see below. PHM updated.   - CBC with Differential/Platelet - CMP14+EGFR - Lipid Profile  2. Mild intermittent asthma without complication Has prn albuterol  inhaler as needed. Routine labs ordered  - CBC with Differential/Platelet - CMP14+EGFR - Lipid Profile  3. Benign hypertension Continue hydrochlorothiazide  as prescribed. Routine labs ordered  - CBC with Differential/Platelet - CMP14+EGFR - Lipid Profile  4. Other migraine without status migrainosus, not intractable Continue topiramate  as prescribed.  - topiramate  (TOPAMAX ) 25 MG tablet; TAKE 1 TABLET BY MOUTH TWICE DAILY  Dispense: 180 tablet; Refill: 1  5. Class 1 obesity due to excess calories with serious comorbidity and body mass index (BMI) of 30.0 to 30.9 in adult Continue topirmate and phentermine  as prescribed. Routine labs ordered  - phentermine  (ADIPEX-P ) 37.5 MG tablet; Take 1 tablet (37.5 mg total) by mouth daily before breakfast.  Dispense: 30 tablet; Refill: 1 - topiramate   (TOPAMAX ) 25 MG tablet; TAKE 1 TABLET BY MOUTH TWICE DAILY  Dispense: 180 tablet; Refill: 1 - CBC with Differential/Platelet - CMP14+EGFR - Lipid Profile     General Counseling: Kayce verbalizes understanding of the findings of todays visit and agrees with plan of treatment. I have discussed any further diagnostic evaluation that may be needed or ordered today. We also reviewed her medications today. she has been encouraged to call the office with any questions or concerns that should arise related to todays visit.    Orders Placed This Encounter  Procedures   CBC with Differential/Platelet   CMP14+EGFR   Lipid Profile    Meds ordered this encounter  Medications   phentermine  (ADIPEX-P ) 37.5 MG tablet    Sig: Take 1 tablet (37.5 mg total) by mouth daily before breakfast.    Dispense:  30 tablet    Refill:  1    For future refills.   topiramate  (TOPAMAX ) 25 MG tablet    Sig: TAKE 1 TABLET BY MOUTH TWICE DAILY    Dispense:  180 tablet    Refill:  1    Return in about 8 weeks (around 08/17/2024) for F/U, Hanley Woerner PCP.   Total time spent:30 Minutes Time spent includes review of chart, medications, test results, and follow up plan with the patient.   [place] Controlled Substance Database was reviewed by me.  This patient was seen by Mardy Maxin, FNP-C in collaboration with Dr. Sigrid Bathe as a part of collaborative care agreement.  Naamah Boggess R. Maxin, MSN, FNP-C Internal medicine

## 2024-07-20 ENCOUNTER — Encounter: Payer: Self-pay | Admitting: Nurse Practitioner

## 2024-08-17 ENCOUNTER — Ambulatory Visit: Admitting: Nurse Practitioner

## 2024-08-25 ENCOUNTER — Telehealth: Payer: Self-pay | Admitting: Nurse Practitioner

## 2024-08-25 NOTE — Telephone Encounter (Signed)
 Left patient vm regarding labs not done for 08/26/24 appointment-Toni

## 2024-08-26 ENCOUNTER — Ambulatory Visit: Admitting: Nurse Practitioner

## 2024-08-27 LAB — CMP14+EGFR
ALT: 32 IU/L (ref 0–32)
AST: 29 IU/L (ref 0–40)
Albumin: 4.3 g/dL (ref 3.8–4.9)
Alkaline Phosphatase: 92 IU/L (ref 49–135)
BUN/Creatinine Ratio: 16 (ref 9–23)
BUN: 15 mg/dL (ref 6–24)
Bilirubin Total: 0.5 mg/dL (ref 0.0–1.2)
CO2: 24 mmol/L (ref 20–29)
Calcium: 9.8 mg/dL (ref 8.7–10.2)
Chloride: 100 mmol/L (ref 96–106)
Creatinine, Ser: 0.92 mg/dL (ref 0.57–1.00)
Globulin, Total: 2.1 g/dL (ref 1.5–4.5)
Glucose: 91 mg/dL (ref 70–99)
Potassium: 3.9 mmol/L (ref 3.5–5.2)
Sodium: 141 mmol/L (ref 134–144)
Total Protein: 6.4 g/dL (ref 6.0–8.5)
eGFR: 72 mL/min/1.73

## 2024-08-27 LAB — CBC WITH DIFFERENTIAL/PLATELET
Basophils Absolute: 0 x10E3/uL (ref 0.0–0.2)
Basos: 0 %
EOS (ABSOLUTE): 0.1 x10E3/uL (ref 0.0–0.4)
Eos: 2 %
Hematocrit: 45.2 % (ref 34.0–46.6)
Hemoglobin: 14.6 g/dL (ref 11.1–15.9)
Immature Grans (Abs): 0 x10E3/uL (ref 0.0–0.1)
Immature Granulocytes: 0 %
Lymphocytes Absolute: 2.4 x10E3/uL (ref 0.7–3.1)
Lymphs: 41 %
MCH: 29.1 pg (ref 26.6–33.0)
MCHC: 32.3 g/dL (ref 31.5–35.7)
MCV: 90 fL (ref 79–97)
Monocytes Absolute: 0.4 x10E3/uL (ref 0.1–0.9)
Monocytes: 6 %
Neutrophils Absolute: 3 x10E3/uL (ref 1.4–7.0)
Neutrophils: 51 %
Platelets: 344 x10E3/uL (ref 150–450)
RBC: 5.01 x10E6/uL (ref 3.77–5.28)
RDW: 12.8 % (ref 11.7–15.4)
WBC: 5.9 x10E3/uL (ref 3.4–10.8)

## 2024-08-27 LAB — LIPID PANEL
Chol/HDL Ratio: 3.1 ratio (ref 0.0–4.4)
Cholesterol, Total: 180 mg/dL (ref 100–199)
HDL: 58 mg/dL
LDL Chol Calc (NIH): 104 mg/dL — ABNORMAL HIGH (ref 0–99)
Triglycerides: 101 mg/dL (ref 0–149)
VLDL Cholesterol Cal: 18 mg/dL (ref 5–40)

## 2024-08-31 ENCOUNTER — Ambulatory Visit: Admitting: Nurse Practitioner

## 2024-08-31 ENCOUNTER — Encounter: Payer: Self-pay | Admitting: Nurse Practitioner

## 2024-08-31 VITALS — BP 130/88 | HR 77 | Temp 96.7°F | Resp 16 | Ht 69.0 in | Wt 189.8 lb

## 2024-08-31 DIAGNOSIS — Z6830 Body mass index (BMI) 30.0-30.9, adult: Secondary | ICD-10-CM

## 2024-08-31 DIAGNOSIS — I10 Essential (primary) hypertension: Secondary | ICD-10-CM | POA: Diagnosis not present

## 2024-08-31 DIAGNOSIS — E66811 Obesity, class 1: Secondary | ICD-10-CM

## 2024-08-31 DIAGNOSIS — E6609 Other obesity due to excess calories: Secondary | ICD-10-CM

## 2024-08-31 DIAGNOSIS — E782 Mixed hyperlipidemia: Secondary | ICD-10-CM | POA: Diagnosis not present

## 2024-08-31 MED ORDER — PHENTERMINE HCL 37.5 MG PO TABS: 37.5000 mg | ORAL_TABLET | Freq: Every day | ORAL | 1 refills | Status: AC

## 2024-08-31 NOTE — Progress Notes (Signed)
 [facility] [address] [place], [place] [address]  Internal MEDICINE  Office Visit Note  Patient Name: Susan Griffith  [id number]  [id number]  Date of Service: 08/31/2024  Chief Complaint  Patient presents with   Follow-up    Review labs     HPI Virgin presents for a follow-up visit for lab results, weight loss.  Weight loss -- down 1 more lb since October. Still want to continue phentermine  for now. Requesting GLP-1 sample. We will see if her insurance will cover next year as well.  Got over stomach virus recently.  Recent labs -- triglyceride level has improved to normal range. LDL improved to 104. All other labs are normal.     Current Medication: Outpatient Encounter Medications as of 08/31/2024  Medication Sig   albuterol  (VENTOLIN  HFA) 108 (90 Base) MCG/ACT inhaler Inhale 2 puffs into the lungs every 6 (six) hours as needed for wheezing or shortness of breath.   hydrochlorothiazide  (HYDRODIURIL ) 12.5 MG tablet Take 1 tablet (12.5 mg total) by mouth daily.   phentermine  (ADIPEX-P ) 37.5 MG tablet Take 1 tablet (37.5 mg total) by mouth daily before breakfast.   topiramate  (TOPAMAX ) 25 MG tablet TAKE 1 TABLET BY MOUTH TWICE DAILY   [DISCONTINUED] phentermine  (ADIPEX-P ) 37.5 MG tablet Take 1 tablet (37.5 mg total) by mouth daily before breakfast.   No facility-administered encounter medications on file as of 08/31/2024.    Surgical History: Past Surgical History:  Procedure Laterality Date   AUGMENTATION MAMMAPLASTY Bilateral 02/2023   child birth     natural   thigh surgery  10/22/2018    Medical History: Past Medical History:  Diagnosis Date   Migraines    Sarcoidosis    Thyroid  nodule     Family History: Family History  Problem Relation Age of Onset   Hyperlipidemia Mother    Hypertension Mother     Social History   Socioeconomic History   Marital status: Married    Spouse name: Not on file   Number of children: Not on  file   Years of education: Not on file   Highest education level: Not on file  Occupational History   Not on file  Tobacco Use   Smoking status: Never   Smokeless tobacco: Never  Substance and Sexual Activity   Alcohol use: Yes    Comment: ocassionally   Drug use: Never   Sexual activity: Not on file  Other Topics Concern   Not on file  Social History Narrative   Not on file   Social Drivers of Health   Tobacco Use: Low Risk (08/31/2024)   Patient History    Smoking Tobacco Use: Never    Smokeless Tobacco Use: Never    Passive Exposure: Not on file  Financial Resource Strain: Not on file  Food Insecurity: Not on file  Transportation Needs: Not on file  Physical Activity: Not on file  Stress: Not on file  Social Connections: Not on file  Intimate Partner Violence: Not on file  Depression (PHQ2-9): Low Risk (06/22/2024)   Depression (PHQ2-9)    PHQ-2 Score: 0  Alcohol Screen: Low Risk (07/29/2023)   Alcohol Screen    Last Alcohol Screening Score (AUDIT): 2  Housing: Not on file  Utilities: Not on file  Health Literacy: Not on file      Review of Systems  Constitutional:  Positive for unexpected weight change. Negative for fatigue and fever.  HENT:  Negative for congestion, mouth sores and postnasal drip.  Respiratory:  Negative for cough.   Cardiovascular:  Negative for chest pain.  Genitourinary:  Negative for flank pain.  Psychiatric/Behavioral: Negative.      Vital Signs: BP 130/88   Pulse 77   Temp (!) 96.7 F (35.9 C)   Resp 16   Ht 5' 9 (1.753 m)   Wt 189 lb 12.8 oz (86.1 kg) Comment: at home  LMP 02/11/2022   SpO2 96%   BMI 28.03 kg/m    Physical Exam Vitals reviewed.  Constitutional:      General: She is not in acute distress.    Appearance: Normal appearance. She is not ill-appearing.  HENT:     Head: Normocephalic and atraumatic.  Eyes:     Pupils: Pupils are equal, round, and reactive to light.  Cardiovascular:     Rate and  Rhythm: Normal rate and regular rhythm.  Pulmonary:     Effort: Pulmonary effort is normal. No respiratory distress.  Skin:    Capillary Refill: Capillary refill takes less than 2 seconds.  Neurological:     Mental Status: She is alert and oriented to person, place, and time.  Psychiatric:        Mood and Affect: Mood normal.        Behavior: Behavior normal.        Assessment/Plan: 1. Benign hypertension (Primary) Stable, contineu hydrochlorothiazide  as prescribed   2. Mixed hyperlipidemia Continue working on weight loss with diet modifications and increase physical activity as tolerated.   3. Class 1 obesity due to excess calories with serious comorbidity and body mass index (BMI) of 30.0 to 30.9 in adult Continue phentermine  as prescribed. And wegovy  sample given to patient. Patient instructed to call her insurance in January to see if they will cover weight loss medications in the new year.  - phentermine  (ADIPEX-P ) 37.5 MG tablet; Take 1 tablet (37.5 mg total) by mouth daily before breakfast.  Dispense: 30 tablet; Refill: 1   General Counseling: Christelle verbalizes understanding of the findings of todays visit and agrees with plan of treatment. I have discussed any further diagnostic evaluation that may be needed or ordered today. We also reviewed her medications today. she has been encouraged to call the office with any questions or concerns that should arise related to todays visit.    No orders of the defined types were placed in this encounter.   Meds ordered this encounter  Medications   phentermine  (ADIPEX-P ) 37.5 MG tablet    Sig: Take 1 tablet (37.5 mg total) by mouth daily before breakfast.    Dispense:  30 tablet    Refill:  1    For future refills.    Return in about 8 weeks (around 10/26/2024) for F/U, Prisha Hiley PCP.   Total time spent:30 Minutes Time spent includes review of chart, medications, test results, and follow up plan with the patient.   [place]  Controlled Substance Database was reviewed by me.  This patient was seen by Mardy Maxin, FNP-C in collaboration with Dr. Sigrid Bathe as a part of collaborative care agreement.   Lisseth Brazeau R. Maxin, MSN, FNP-C Internal medicine

## 2024-09-21 ENCOUNTER — Encounter: Payer: Self-pay | Admitting: Nurse Practitioner

## 2024-09-21 ENCOUNTER — Telehealth: Admitting: Nurse Practitioner

## 2024-09-21 VITALS — Resp 16 | Ht 69.0 in | Wt 185.0 lb

## 2024-09-21 DIAGNOSIS — J069 Acute upper respiratory infection, unspecified: Secondary | ICD-10-CM

## 2024-09-21 DIAGNOSIS — R051 Acute cough: Secondary | ICD-10-CM | POA: Diagnosis not present

## 2024-09-21 MED ORDER — AMOXICILLIN-POT CLAVULANATE 875-125 MG PO TABS: 1.0000 | ORAL_TABLET | Freq: Two times a day (BID) | ORAL | 0 refills | Status: AC

## 2024-09-21 MED ORDER — HYDROCOD POLI-CHLORPHE POLI ER 10-8 MG/5ML PO SUER: 5.0000 mL | Freq: Two times a day (BID) | ORAL | 0 refills | Status: AC | PRN

## 2024-09-21 NOTE — Progress Notes (Signed)
 [facility] [address] [place], [place] [address]  Internal MEDICINE  Telephone Visit  Patient Name: Susan Griffith  [id number]  [id number]  Date of Service: 09/21/2024  I connected with the patient at 1340 by telephone and verified the patients identity using two identifiers.   I discussed the limitations, risks, security and privacy concerns of performing an evaluation and management service by telephone and the availability of in person appointments. I also discussed with the patient that there may be a patient responsible charge related to the service.  The patient expressed understanding and agrees to proceed.    Chief Complaint  Patient presents with   Telephone Assessment   Telephone Screen    Coughing and congestion.     HPI Susan Griffith presents for a telehealth virtual visit for URI symptoms. Onset of symptoms was about 4 days ago --reports chest congestion, sinus pressure, cough, trouble sleeping, fatigue, malaise, body aches, wheezing, chest tightness and SOB.    Current Medication: Outpatient Encounter Medications as of 09/21/2024  Medication Sig   amoxicillin -clavulanate (AUGMENTIN ) 875-125 MG tablet Take 1 tablet by mouth 2 (two) times daily for 10 days. May take with food   chlorpheniramine-HYDROcodone (TUSSIONEX) 10-8 MG/5ML Take 5 mLs by mouth every 12 (twelve) hours as needed for cough.   albuterol  (VENTOLIN  HFA) 108 (90 Base) MCG/ACT inhaler Inhale 2 puffs into the lungs every 6 (six) hours as needed for wheezing or shortness of breath.   hydrochlorothiazide  (HYDRODIURIL ) 12.5 MG tablet Take 1 tablet (12.5 mg total) by mouth daily.   phentermine  (ADIPEX-P ) 37.5 MG tablet Take 1 tablet (37.5 mg total) by mouth daily before breakfast.   topiramate  (TOPAMAX ) 25 MG tablet TAKE 1 TABLET BY MOUTH TWICE DAILY   No facility-administered encounter medications on file as of 09/21/2024.    Surgical History: Past Surgical History:  Procedure  Laterality Date   AUGMENTATION MAMMAPLASTY Bilateral 02/2023   child birth     natural   thigh surgery  10/22/2018    Medical History: Past Medical History:  Diagnosis Date   Migraines    Sarcoidosis    Thyroid  nodule     Family History: Family History  Problem Relation Age of Onset   Hyperlipidemia Mother    Hypertension Mother     Social History   Socioeconomic History   Marital status: Married    Spouse name: Not on file   Number of children: Not on file   Years of education: Not on file   Highest education level: Not on file  Occupational History   Not on file  Tobacco Use   Smoking status: Never   Smokeless tobacco: Never  Substance and Sexual Activity   Alcohol use: Yes    Comment: ocassionally   Drug use: Never   Sexual activity: Not on file  Other Topics Concern   Not on file  Social History Narrative   Not on file   Social Drivers of Health   Tobacco Use: Low Risk (09/21/2024)   Patient History    Smoking Tobacco Use: Never    Smokeless Tobacco Use: Never    Passive Exposure: Not on file  Financial Resource Strain: Not on file  Food Insecurity: Not on file  Transportation Needs: Not on file  Physical Activity: Not on file  Stress: Not on file  Social Connections: Not on file  Intimate Partner Violence: Not on file  Depression (PHQ2-9): Low Risk (06/22/2024)   Depression (PHQ2-9)    PHQ-2 Score: 0  Alcohol Screen: Low Risk (07/29/2023)   Alcohol Screen    Last Alcohol Screening Score (AUDIT): 2  Housing: Not on file  Utilities: Not on file  Health Literacy: Not on file      Review of Systems  Constitutional:  Positive for fatigue. Negative for appetite change, chills and fever.  HENT:  Positive for congestion, postnasal drip, rhinorrhea, sinus pressure and sinus pain. Negative for sore throat.   Respiratory:  Positive for cough, chest tightness, shortness of breath and wheezing.   Cardiovascular: Negative.  Negative for chest pain and  palpitations.  Gastrointestinal: Negative.   Musculoskeletal:  Positive for myalgias.  Neurological:  Positive for headaches.    Vital Signs: Resp 16   Ht 5' 9 (1.753 m)   Wt 185 lb (83.9 kg)   LMP 02/11/2022   BMI 27.32 kg/m    Observation/Objective: She is alert and oriented. No acute distress noted.     Assessment/Plan: 1. Upper respiratory tract infection, unspecified type (Primary) Augmentin  prescribed, take until gone.  - amoxicillin -clavulanate (AUGMENTIN ) 875-125 MG tablet; Take 1 tablet by mouth 2 (two) times daily for 10 days. May take with food  Dispense: 20 tablet; Refill: 0  2. Acute cough Cough medication prescribed, take as needed.  - chlorpheniramine-HYDROcodone (TUSSIONEX) 10-8 MG/5ML; Take 5 mLs by mouth every 12 (twelve) hours as needed for cough.  Dispense: 140 mL; Refill: 0   General Counseling: Daun verbalizes understanding of the findings of today's phone visit and agrees with plan of treatment. I have discussed any further diagnostic evaluation that may be needed or ordered today. We also reviewed her medications today. she has been encouraged to call the office with any questions or concerns that should arise related to todays visit.  Return if symptoms worsen or fail to improve.   No orders of the defined types were placed in this encounter.   Meds ordered this encounter  Medications   amoxicillin -clavulanate (AUGMENTIN ) 875-125 MG tablet    Sig: Take 1 tablet by mouth 2 (two) times daily for 10 days. May take with food    Dispense:  20 tablet    Refill:  0    Fill script today please   chlorpheniramine-HYDROcodone (TUSSIONEX) 10-8 MG/5ML    Sig: Take 5 mLs by mouth every 12 (twelve) hours as needed for cough.    Dispense:  140 mL    Refill:  0    Fill new script today    Time spent:20 Minutes Time spent with patient included reviewing progress notes, labs, imaging studies, and discussing plan for follow up.  [place] Controlled Substance  Database was reviewed by me for overdose risk score (ORS) if appropriate.  This patient was seen by Mardy Maxin, FNP-C in collaboration with Dr. Sigrid Bathe as a part of collaborative care agreement.  Oluwadamilola Deliz R. Maxin, MSN, FNP-C Internal medicine

## 2024-10-26 ENCOUNTER — Ambulatory Visit: Admitting: Nurse Practitioner

## 2025-06-23 ENCOUNTER — Encounter: Admitting: Nurse Practitioner
